# Patient Record
Sex: Male | Born: 1935 | Race: White | Hispanic: No | Smoking: Former smoker
Health system: Southern US, Community
[De-identification: ages and names within clinical notes are randomized; demographics above are authoritative.]

## PROBLEM LIST (undated history)

## (undated) DIAGNOSIS — E7800 Pure hypercholesterolemia, unspecified: Secondary | ICD-10-CM

## (undated) DIAGNOSIS — I219 Acute myocardial infarction, unspecified: Secondary | ICD-10-CM

## (undated) DIAGNOSIS — F0390 Unspecified dementia without behavioral disturbance: Secondary | ICD-10-CM

## (undated) DIAGNOSIS — I10 Essential (primary) hypertension: Secondary | ICD-10-CM

## (undated) DIAGNOSIS — G459 Transient cerebral ischemic attack, unspecified: Secondary | ICD-10-CM

## (undated) DIAGNOSIS — I629 Nontraumatic intracranial hemorrhage, unspecified: Secondary | ICD-10-CM

## (undated) DIAGNOSIS — K219 Gastro-esophageal reflux disease without esophagitis: Secondary | ICD-10-CM

## (undated) DIAGNOSIS — Z950 Presence of cardiac pacemaker: Secondary | ICD-10-CM

## (undated) DIAGNOSIS — J449 Chronic obstructive pulmonary disease, unspecified: Secondary | ICD-10-CM

## (undated) DIAGNOSIS — I2510 Atherosclerotic heart disease of native coronary artery without angina pectoris: Secondary | ICD-10-CM

## (undated) HISTORY — PX: PACEMAKER PLACEMENT: SHX43

## (undated) HISTORY — PX: CHOLECYSTECTOMY: SHX55

## (undated) HISTORY — PX: OTHER SURGICAL HISTORY: SHX169

## (undated) HISTORY — PX: APPENDECTOMY: SHX54

---

## 1999-06-11 ENCOUNTER — Inpatient Hospital Stay (HOSPITAL_COMMUNITY): Admission: EM | Admit: 1999-06-11 | Discharge: 1999-06-15 | Payer: Self-pay | Admitting: Cardiology

## 1999-06-13 ENCOUNTER — Encounter: Payer: Self-pay | Admitting: Cardiothoracic Surgery

## 1999-08-19 ENCOUNTER — Inpatient Hospital Stay (HOSPITAL_COMMUNITY): Admission: EM | Admit: 1999-08-19 | Discharge: 1999-08-22 | Payer: Self-pay | Admitting: Internal Medicine

## 1999-08-19 ENCOUNTER — Encounter: Payer: Self-pay | Admitting: Internal Medicine

## 1999-08-20 ENCOUNTER — Encounter: Payer: Self-pay | Admitting: Internal Medicine

## 2000-03-02 ENCOUNTER — Inpatient Hospital Stay (HOSPITAL_COMMUNITY): Admission: EM | Admit: 2000-03-02 | Discharge: 2000-03-07 | Payer: Self-pay | Admitting: Emergency Medicine

## 2000-03-02 ENCOUNTER — Encounter: Payer: Self-pay | Admitting: Emergency Medicine

## 2000-03-03 ENCOUNTER — Encounter: Payer: Self-pay | Admitting: Surgery

## 2000-03-04 ENCOUNTER — Encounter: Payer: Self-pay | Admitting: Surgery

## 2000-03-08 ENCOUNTER — Encounter: Payer: Self-pay | Admitting: General Surgery

## 2000-03-08 ENCOUNTER — Inpatient Hospital Stay (HOSPITAL_COMMUNITY): Admission: EM | Admit: 2000-03-08 | Discharge: 2000-03-12 | Payer: Self-pay | Admitting: Emergency Medicine

## 2000-03-09 ENCOUNTER — Encounter: Payer: Self-pay | Admitting: Family Medicine

## 2000-03-25 ENCOUNTER — Encounter: Admission: RE | Admit: 2000-03-25 | Discharge: 2000-03-25 | Payer: Self-pay | Admitting: Family Medicine

## 2000-04-30 ENCOUNTER — Ambulatory Visit (HOSPITAL_COMMUNITY): Admission: RE | Admit: 2000-04-30 | Discharge: 2000-04-30 | Payer: Self-pay

## 2000-07-17 ENCOUNTER — Emergency Department (HOSPITAL_COMMUNITY): Admission: EM | Admit: 2000-07-17 | Discharge: 2000-07-17 | Payer: Self-pay | Admitting: Emergency Medicine

## 2010-10-31 ENCOUNTER — Emergency Department (HOSPITAL_COMMUNITY): Payer: Medicare Other

## 2010-10-31 ENCOUNTER — Encounter (HOSPITAL_COMMUNITY): Payer: Self-pay | Admitting: Radiology

## 2010-10-31 ENCOUNTER — Inpatient Hospital Stay (HOSPITAL_COMMUNITY)
Admission: EM | Admit: 2010-10-31 | Discharge: 2010-11-10 | DRG: 064 | Disposition: A | Discharge status: Skilled Nursing Facility | Payer: Medicare Other | Attending: Internal Medicine | Admitting: Internal Medicine

## 2010-10-31 DIAGNOSIS — G08 Intracranial and intraspinal phlebitis and thrombophlebitis: Secondary | ICD-10-CM | POA: Diagnosis present

## 2010-10-31 DIAGNOSIS — I6200 Nontraumatic subdural hemorrhage, unspecified: Principal | ICD-10-CM | POA: Diagnosis present

## 2010-10-31 DIAGNOSIS — J9600 Acute respiratory failure, unspecified whether with hypoxia or hypercapnia: Secondary | ICD-10-CM | POA: Diagnosis present

## 2010-10-31 DIAGNOSIS — R4182 Altered mental status, unspecified: Secondary | ICD-10-CM | POA: Diagnosis present

## 2010-10-31 DIAGNOSIS — F341 Dysthymic disorder: Secondary | ICD-10-CM | POA: Diagnosis present

## 2010-10-31 DIAGNOSIS — Z950 Presence of cardiac pacemaker: Secondary | ICD-10-CM

## 2010-10-31 DIAGNOSIS — Z7982 Long term (current) use of aspirin: Secondary | ICD-10-CM

## 2010-10-31 DIAGNOSIS — I959 Hypotension, unspecified: Secondary | ICD-10-CM | POA: Diagnosis present

## 2010-10-31 DIAGNOSIS — I252 Old myocardial infarction: Secondary | ICD-10-CM

## 2010-10-31 DIAGNOSIS — N400 Enlarged prostate without lower urinary tract symptoms: Secondary | ICD-10-CM | POA: Diagnosis present

## 2010-10-31 DIAGNOSIS — R5381 Other malaise: Secondary | ICD-10-CM | POA: Diagnosis present

## 2010-10-31 DIAGNOSIS — E039 Hypothyroidism, unspecified: Secondary | ICD-10-CM | POA: Diagnosis present

## 2010-10-31 DIAGNOSIS — I2510 Atherosclerotic heart disease of native coronary artery without angina pectoris: Secondary | ICD-10-CM | POA: Diagnosis present

## 2010-10-31 DIAGNOSIS — Z87891 Personal history of nicotine dependence: Secondary | ICD-10-CM

## 2010-10-31 DIAGNOSIS — R7881 Bacteremia: Secondary | ICD-10-CM | POA: Diagnosis present

## 2010-10-31 DIAGNOSIS — X58XXXA Exposure to other specified factors, initial encounter: Secondary | ICD-10-CM | POA: Diagnosis present

## 2010-10-31 DIAGNOSIS — I10 Essential (primary) hypertension: Secondary | ICD-10-CM | POA: Diagnosis present

## 2010-10-31 DIAGNOSIS — R627 Adult failure to thrive: Secondary | ICD-10-CM | POA: Diagnosis present

## 2010-10-31 DIAGNOSIS — J4489 Other specified chronic obstructive pulmonary disease: Secondary | ICD-10-CM | POA: Diagnosis present

## 2010-10-31 DIAGNOSIS — J449 Chronic obstructive pulmonary disease, unspecified: Secondary | ICD-10-CM | POA: Diagnosis present

## 2010-10-31 DIAGNOSIS — Z8673 Personal history of transient ischemic attack (TIA), and cerebral infarction without residual deficits: Secondary | ICD-10-CM

## 2010-10-31 DIAGNOSIS — M2749 Other cysts of jaw: Secondary | ICD-10-CM | POA: Diagnosis present

## 2010-10-31 DIAGNOSIS — K219 Gastro-esophageal reflux disease without esophagitis: Secondary | ICD-10-CM | POA: Diagnosis present

## 2010-10-31 DIAGNOSIS — G40909 Epilepsy, unspecified, not intractable, without status epilepticus: Secondary | ICD-10-CM | POA: Diagnosis present

## 2010-10-31 DIAGNOSIS — G9340 Encephalopathy, unspecified: Secondary | ICD-10-CM | POA: Diagnosis present

## 2010-10-31 DIAGNOSIS — E860 Dehydration: Secondary | ICD-10-CM | POA: Diagnosis present

## 2010-10-31 DIAGNOSIS — N289 Disorder of kidney and ureter, unspecified: Secondary | ICD-10-CM | POA: Diagnosis present

## 2010-10-31 DIAGNOSIS — E119 Type 2 diabetes mellitus without complications: Secondary | ICD-10-CM | POA: Diagnosis present

## 2010-10-31 DIAGNOSIS — F068 Other specified mental disorders due to known physiological condition: Secondary | ICD-10-CM | POA: Diagnosis present

## 2010-10-31 HISTORY — DX: Presence of cardiac pacemaker: Z95.0

## 2010-10-31 HISTORY — DX: Transient cerebral ischemic attack, unspecified: G45.9

## 2010-10-31 HISTORY — DX: Pure hypercholesterolemia, unspecified: E78.00

## 2010-10-31 HISTORY — DX: Unspecified dementia, unspecified severity, without behavioral disturbance, psychotic disturbance, mood disturbance, and anxiety: F03.90

## 2010-10-31 HISTORY — DX: Chronic obstructive pulmonary disease, unspecified: J44.9

## 2010-10-31 HISTORY — DX: Essential (primary) hypertension: I10

## 2010-10-31 LAB — URINE MICROSCOPIC-ADD ON

## 2010-10-31 LAB — BLOOD GAS, ARTERIAL
Acid-base deficit: 5.3 mmol/L — ABNORMAL HIGH (ref 0.0–2.0)
Drawn by: 28340
FIO2: 1 %
MECHVT: 500 mL
O2 Saturation: 99.9 %
Patient temperature: 98.6
RATE: 14 resp/min
TCO2: 20.5 mmol/L (ref 0–100)
pCO2 arterial: 36.5 mmHg (ref 35.0–45.0)

## 2010-10-31 LAB — CBC
HCT: 39.1 % (ref 39.0–52.0)
Hemoglobin: 12.5 g/dL — ABNORMAL LOW (ref 13.0–17.0)
MCH: 28 pg (ref 26.0–34.0)
MCHC: 32 g/dL (ref 30.0–36.0)
MCV: 87.5 fL (ref 78.0–100.0)
Platelets: 176 10*3/uL (ref 150–400)
RBC: 4.47 MIL/uL (ref 4.22–5.81)
RDW: 14.1 % (ref 11.5–15.5)
WBC: 12.1 10*3/uL — ABNORMAL HIGH (ref 4.0–10.5)

## 2010-10-31 LAB — BASIC METABOLIC PANEL
BUN: 25 mg/dL — ABNORMAL HIGH (ref 6–23)
CO2: 21 mEq/L (ref 19–32)
Calcium: 9 mg/dL (ref 8.4–10.5)
Chloride: 105 mEq/L (ref 96–112)
Creatinine, Ser: 1.55 mg/dL — ABNORMAL HIGH (ref 0.50–1.35)
GFR calc Af Amer: 52 mL/min — ABNORMAL LOW (ref >60)
GFR calc non Af Amer: 43 mL/min — ABNORMAL LOW (ref >60)
Glucose, Bld: 136 mg/dL — ABNORMAL HIGH (ref 70–99)
Potassium: 4.7 mEq/L (ref 3.5–5.1)
Sodium: 136 mEq/L (ref 135–145)

## 2010-10-31 LAB — URINALYSIS, ROUTINE W REFLEX MICROSCOPIC
Bilirubin Urine: NEGATIVE
Glucose, UA: NEGATIVE mg/dL
Ketones, ur: 15 mg/dL — AB
Nitrite: NEGATIVE
Protein, ur: 100 mg/dL — AB
Specific Gravity, Urine: 1.022 (ref 1.005–1.030)
Urobilinogen, UA: 0.2 mg/dL (ref 0.0–1.0)
pH: 5.5 (ref 5.0–8.0)

## 2010-10-31 LAB — MRSA PCR SCREENING: MRSA by PCR: NEGATIVE

## 2010-11-01 ENCOUNTER — Inpatient Hospital Stay (HOSPITAL_COMMUNITY): Payer: Medicare Other

## 2010-11-01 ENCOUNTER — Encounter (HOSPITAL_COMMUNITY): Payer: Self-pay

## 2010-11-01 LAB — MAGNESIUM: Magnesium: 2.3 mg/dL (ref 1.5–2.5)

## 2010-11-01 LAB — BLOOD GAS, ARTERIAL
Acid-base deficit: 5.1 mmol/L — ABNORMAL HIGH (ref 0.0–2.0)
Bicarbonate: 19.5 mEq/L — ABNORMAL LOW (ref 20.0–24.0)
FIO2: 0.4 %
MECHVT: 500 mL
Patient temperature: 98.6
TCO2: 20.6 mmol/L (ref 0–100)
pH, Arterial: 7.355 (ref 7.350–7.450)

## 2010-11-01 LAB — GLUCOSE, CAPILLARY
Glucose-Capillary: 127 mg/dL — ABNORMAL HIGH (ref 70–99)
Glucose-Capillary: 117 mg/dL — ABNORMAL HIGH (ref 70–99)
Glucose-Capillary: 107 mg/dL — ABNORMAL HIGH (ref 70–99)
Glucose-Capillary: 109 mg/dL — ABNORMAL HIGH (ref 70–99)
Glucose-Capillary: 117 mg/dL — ABNORMAL HIGH (ref 70–99)
Glucose-Capillary: 108 mg/dL — ABNORMAL HIGH (ref 70–99)

## 2010-11-01 LAB — BASIC METABOLIC PANEL
CO2: 21 mEq/L (ref 19–32)
Calcium: 8.2 mg/dL — ABNORMAL LOW (ref 8.4–10.5)
Chloride: 109 mEq/L (ref 96–112)
Glucose, Bld: 131 mg/dL — ABNORMAL HIGH (ref 70–99)
Potassium: 4.9 mEq/L (ref 3.5–5.1)
Sodium: 137 mEq/L (ref 135–145)

## 2010-11-01 LAB — CBC
Hemoglobin: 12.1 g/dL — ABNORMAL LOW (ref 13.0–17.0)
Platelets: 164 10*3/uL (ref 150–400)
RBC: 4.13 MIL/uL — ABNORMAL LOW (ref 4.22–5.81)
WBC: 10.8 10*3/uL — ABNORMAL HIGH (ref 4.0–10.5)

## 2010-11-01 LAB — APTT: aPTT: 33 seconds (ref 24–37)

## 2010-11-01 LAB — PHOSPHORUS: Phosphorus: 2.9 mg/dL (ref 2.3–4.6)

## 2010-11-01 MED ORDER — IOHEXOL 350 MG/ML SOLN
50.0000 mL | Freq: Once | INTRAVENOUS | Status: AC | PRN
  Administered 2010-11-01: 50 mL via INTRAVENOUS

## 2010-11-01 NOTE — H&P (Signed)
[name], [name]               ACCOUNT NO.:  [id number]  MEDICAL RECORD NO.:  [id number]  LOCATION:  [facility]                         FACILITY:  [facility]  PHYSICIAN:  Isidor Holts, M.D.  DATE OF BIRTH:  1921-08-15  DATE OF ADMISSION:  10/31/2010 DATE OF DISCHARGE:                             HISTORY & PHYSICAL   PRIMARY MD:  Dr. Renae Fickle at [facility]  PRIMARY CARDIOLOGIST:  Dr. Rudolpho Sevin At [facility] (was previously under the care of Dr. Myrtis Ser, [facility] Cardiologist).  CHIEF COMPLAINT:  Altered mental status and failure to thrive.  HISTORY OF PRESENT ILLNESS:  This is an 77 year old male.  The patient is responsive only to pain stimuli and therefore was quite unable to contribute to history.  History was therefore gleaned from family, who were present in the Emergency Department.  According to the family, the patient was recently hospitalized at [facility] for 5 days, for altered mental status and fever, and had extensive workup including TEE, got a septic workup for possible source of infection, all of which proved to be negative.  The patient was subsequently discharged on October 27, 2010 on a 10-day course of cephalosporin. However, since discharge, his condition has deteriorated and for the past 2 days, he has not been eating or talking and has come progressively weak.  He was therefore brought to the Emergency Department at [facility].  PAST MEDICAL HISTORY: 1. COPD. 2. Coronary artery disease status post cardiac catheterization on     June 13, 1999, which showed moderate disease. 3. Status post NSTEMI in June 2001. 4. Hypertension. 5. Sick sinus syndrome status post permanent pacer for symptomatic     bradycardia. 6. Status post previous CVA. 7. Dementia. 8. Seizure disorder. 9. Depression/anxiety. 10.History of manic depressive disorder. 11.Cholelithiasis. 12.Diverticulosis. 13.Remote history of GI bleed secondary to  stress ulcer. 14.GERD. 15.Type II diabetes mellitus. 16.Hypothyroidism. 17.BPH. 18.DJD status post back surgery. 19.Legally blind. 20.Status post appendectomy.  ALLERGIES:  No known drug allergies.  MEDICATIONS: 1. Aspirin 81 mg p.o. daily. 2. Cefepime IV 2 g q.12 h. for 10 days from October 27, 2010. 3. Dilantin 100 mg p.o. t.i.d. 4. Glipizide 5 mg p.o. daily. 5. Lovastatin 10 mg p.o. daily. 6. Omeprazole 20 mg p.o. daily. 7. Synthroid 175 mcg p.o. daily. 8. Tamsulosin 0.4 mg p.o. at bedtime. 9. Combivent 2 puffs p.r.n. q.6 h. 10.Haldol 2 mg p.o. b.i.d. p.r.n. 11.Nitroglycerin 0.4 mg sublingually p.r.n. for chest pain.  REVIEW OF SYSTEMS:  Unobtainable from the patient, although according to the patient's family, he has no vomiting or diarrhea, neither does he have shortness of breath. Otherwise, as per HPI and chief complaint.  SOCIAL HISTORY:  The patient resides with his daughter, Steward Drone.  Ex- smoker, quit several years ago.  Nondrinker currently.  He has 5 offsprings.  FAMILY HISTORY:  Unobtainable at this time.  PHYSICAL EXAMINATION:  VITAL SIGNS:  Temperature 99.5, pulse 70 per minute and regular, respiratory rate 26, BP 124/73 mmHg, rechecked 152/67 mmHg, and pulse oximeter 96% on room air. GENERAL:  The patient was very poorly responsive at the time of this evaluation, not responding to verbal commands.  Responds to pain stimuli with sternal rub with grimacing. Mouth is partly open.  Oral mucous membranes appear dry. NECK:  Supple.  JVP not seen. CHEST:  Clinically clear to auscultation.  No wheezes or crackles. HEART:  Sounds one and two heard, normal  and regular.  No murmurs. ABDOMEN:  Moderately obese, soft, and nontender.  No palpable organomegaly.  No palpable masses.  Bowel sounds are heard. LOWER EXTREMITIES:  No pitting edema.  Palpable peripheral pulses. MUSCULOSKELETAL:  The patient does have generalized osteoarthritic changes.  However, unable to  fully examine secondary to lack of cooperation.  It appears that the patient does have lower extremity contractures. CENTRAL NERVOUS SYSTEM:  Essentially as described above.  No obvious facial asymmetry.  However, the patient is not cooperative and certainly was not moving any of his limbs at the time of this evaluation.  INVESTIGATIONS:  CBC; WBC 12.1, hemoglobin 12.5, hematocrit 39.1, and platelets 176.  Electrolytes; sodium 136, potassium 4.7, chloride 105, CO2 of 21, BUN 25, creatinine 1.55, and glucose 136.  Urinalysis shows wbc's 0-2, rbc's 0-2, bacteria few.  Chest x-ray on October 31, 2010, shows low lung volumes.  No acute findings.  There was a 12-lead transvenous pacemaker in situ.  A 12-lead EKG on October 31, 2010, showed sinus rhythm and possible left bundle-branch block pattern.  ASSESSMENT AND PLAN: 1. Altered mental status.  The patient is very poorly responsive and     currently has no obvious focus of infection.  Differential     diagnostic considerations are somewhat broad and include possible     cerebrovascular accident, given his known history of cerebrovascular     disease and hypertension, toxic metabolic encephalopathy, sepsis,     and central nervous system infection.  We shall admit to step-down     unit for close observation, do brain CT scan as MRI is not feasible     due to the presence of pacemaker.  Do complete septic workup.     Commence empiric antibiotic treatment with a combination of     vancomycin and Primaxin.  The patient will need lumbar puncture if     head CT scan is negative.  2. Dehydration/acute renal insufficiency.  Baseline creatinine is     unknown, but the patient does have an elevated BUN/creatinine ratio     and clinically appears "dry."  We shall manage with intravenous     fluid hydration.  3. Hypothyroidism.  We shall continue replacement therapy, albeit IV.  4. Type II diabetes mellitus.  The patient's random blood glucose  is     currently satisfactory at 136.  We shall place him on a sliding     scale insulin coverage for now.  5. History of seizure disorder.  We shall continue with Dilantin,     albeit intravenously and do EEG.   Further management will depend on clinical course.    Note: I have had a detailed discussion about the     patient and his clinical condition with his son, who was present in     the Emergency Department as well as his daughter, Steward Drone (telephone     number [phone]) and according to the family, the patient is     full code, although I have indicated to them that his prognosis is     very guarded.     Isidor Holts, M.D.     CO/MEDQ  D:  10/31/2010  T:  10/31/2010  Job:  [id number]  cc:   Renae Fickle Dr. Rudolpho Sevin  Electronically Signed by Isidor Holts M.D. on 11/01/2010 12:22:13 PM

## 2010-11-02 ENCOUNTER — Inpatient Hospital Stay (HOSPITAL_COMMUNITY): Payer: Medicare Other

## 2010-11-02 DIAGNOSIS — I6200 Nontraumatic subdural hemorrhage, unspecified: Secondary | ICD-10-CM

## 2010-11-02 DIAGNOSIS — J9600 Acute respiratory failure, unspecified whether with hypoxia or hypercapnia: Secondary | ICD-10-CM

## 2010-11-02 DIAGNOSIS — R4182 Altered mental status, unspecified: Secondary | ICD-10-CM

## 2010-11-02 LAB — GLUCOSE, CAPILLARY
Glucose-Capillary: 135 mg/dL — ABNORMAL HIGH (ref 70–99)
Glucose-Capillary: 147 mg/dL — ABNORMAL HIGH (ref 70–99)
Glucose-Capillary: 136 mg/dL — ABNORMAL HIGH (ref 70–99)

## 2010-11-02 LAB — BLOOD GAS, ARTERIAL
Acid-base deficit: 6.8 mmol/L — ABNORMAL HIGH (ref 0.0–2.0)
FIO2: 0.3 %
TCO2: 18.4 mmol/L (ref 0–100)
pCO2 arterial: 30.6 mmHg — ABNORMAL LOW (ref 35.0–45.0)
pO2, Arterial: 103 mmHg — ABNORMAL HIGH (ref 80.0–100.0)

## 2010-11-02 LAB — BASIC METABOLIC PANEL
BUN: 24 mg/dL — ABNORMAL HIGH (ref 6–23)
CO2: 19 mEq/L (ref 19–32)
Chloride: 111 mEq/L (ref 96–112)
Creatinine, Ser: 1.33 mg/dL (ref 0.50–1.35)

## 2010-11-02 LAB — URINE CULTURE
Culture  Setup Time: 201209051419
Culture: NO GROWTH

## 2010-11-02 LAB — CBC
HCT: 33.1 % — ABNORMAL LOW (ref 39.0–52.0)
MCV: 89 fL (ref 78.0–100.0)
RBC: 3.72 MIL/uL — ABNORMAL LOW (ref 4.22–5.81)
WBC: 9.7 10*3/uL (ref 4.0–10.5)

## 2010-11-02 LAB — MAGNESIUM: Magnesium: 2.2 mg/dL (ref 1.5–2.5)

## 2010-11-03 ENCOUNTER — Inpatient Hospital Stay (HOSPITAL_COMMUNITY): Payer: Medicare Other

## 2010-11-03 LAB — HEPARIN LEVEL (UNFRACTIONATED)
Heparin Unfractionated: 0.14 IU/mL — ABNORMAL LOW (ref 0.30–0.70)
Heparin Unfractionated: 0.16 IU/mL — ABNORMAL LOW (ref 0.30–0.70)

## 2010-11-03 LAB — BLOOD GAS, ARTERIAL
Drawn by: 347641
MECHVT: 500 mL
Patient temperature: 98.6
RATE: 14 resp/min
pCO2 arterial: 34.5 mmHg — ABNORMAL LOW (ref 35.0–45.0)
pH, Arterial: 7.349 — ABNORMAL LOW (ref 7.350–7.450)

## 2010-11-03 LAB — CBC
HCT: 30.8 % — ABNORMAL LOW (ref 39.0–52.0)
Hemoglobin: 9.9 g/dL — ABNORMAL LOW (ref 13.0–17.0)
MCV: 89.3 fL (ref 78.0–100.0)
RBC: 3.45 MIL/uL — ABNORMAL LOW (ref 4.22–5.81)
RDW: 14.5 % (ref 11.5–15.5)
WBC: 7.7 10*3/uL (ref 4.0–10.5)

## 2010-11-03 LAB — BASIC METABOLIC PANEL
BUN: 28 mg/dL — ABNORMAL HIGH (ref 6–23)
CO2: 19 mEq/L (ref 19–32)
Chloride: 114 mEq/L — ABNORMAL HIGH (ref 96–112)
Creatinine, Ser: 1.28 mg/dL (ref 0.50–1.35)
Glucose, Bld: 136 mg/dL — ABNORMAL HIGH (ref 70–99)

## 2010-11-03 LAB — CULTURE, RESPIRATORY W GRAM STAIN

## 2010-11-03 LAB — GLUCOSE, CAPILLARY
Glucose-Capillary: 101 mg/dL — ABNORMAL HIGH (ref 70–99)
Glucose-Capillary: 102 mg/dL — ABNORMAL HIGH (ref 70–99)

## 2010-11-03 NOTE — Consult Note (Signed)
NAMEKYIAN, OBST               ACCOUNT NO.:  [id number]  MEDICAL RECORD NO.:  [id number]  LOCATION:  3109                         FACILITY:  [facility]  PHYSICIAN:  Levie Heritage, MD       DATE OF BIRTH:  11-Mar-1921  DATE OF CONSULTATION: DATE OF DISCHARGE:                                CONSULTATION   REASON FOR CONSULTATION:  Altered mental status in the setting of falx subdural hematoma.  This is a 77 year old male Caucasian patient who was brought to [facility] secondary to altered mental status and unresponsiveness. Majority of the history was obtained from the chart as the patient's family members and the patient is unable to provide history.  Apparently the patient was recently hospitalized at [facility] for approximately 5 days for altered mental status and fever.  An extensive workup was done at [facility] including TEE and septic workup.  No source of infection was found at that time.  The patient was subsequently discharged on October 27, 2010 for 10-day course of cephalosporin.  However, due to not talking, eating, and failure to thrive, the patient was brought back to [facility].  Upon admission to [facility], the patient had a CT scan of his brain which showed subdural pain hemorrhage along the falx and tentorium.  No mass effect or midline shift was noted.  The patient had second CT scan while hospitalized on September 5 which showed no change in subdural hematoma and appeared to be stable.  Neurology was consulted for both prognostication and recommendations for treatment of subdural hematoma.  PAST MEDICAL HISTORY:  COPD, coronary artery disease status post non- STEMI in June 2001, hypertension, sick sinus syndrome, status post pacemaker for symptomatic bradycardia, status post previous CVA, dementia, seizure disorder, depression, anxiety, history of manic depressive disorder, cholelithiasis,  diverticulosis.  History of GI bleed secondary to stress ulcer, GERD, type 2 diabetes, hypothyroidism, BPH, DJD, legally blind, status post appendectomy.  ALLERGIES:  No known drug allergies.  While hospitalized, the patient has been placed on alteplase x1, sliding- scale insulin, levothyroxine, Protonix, Dilantin 100 mg IV q.8 h., Zosyn, vancomycin, Tylenol, Combivent.  FAMILY HISTORY:  Noncontributory.  SOCIAL HISTORY:  Unable to be obtained.  REVIEW OF SYSTEMS:  Unable to be obtained.  PHYSICAL EXAMINATION:  VITAL SIGNS:  Blood pressure is 130/80, pulse 70, respiration 18. GENERAL:  The patient is at this point in time intubated.  He is on propofol. NEUROLOGIC:  Pupils equal, round, reactive to light and accommodating. Positive doll's eyes with head movement.  He does not blink to threat. He has positive corneal reflexes.  Face appears symmetrical.  Sensation is grossly intact.  Finger-to-nose unobtainable.  Heel-to-shin unobtainable.  Motor:  Does not show any purposeful spontaneous movements, however, he does have normal tone and bulk.  Deep tendon reflexes are 2+ throughout in the upper extremities, 1+ in the patellas, 0 at the Achilles.  He has downgoing toes.  Sensation is grossly intact as he winces to pain in all 4 extremities.  He also withdraws to pain and bilateral legs and bilateral upper extremities. PULMONARY:  Clear to auscultation. CARDIOVASCULAR:  S1, S2 is audible. NECK:  Negative for bruits.  LABORATORY DATA:  Sodium 137, potassium 4.9 chloride 109, CO2 is 21, glucose 131, BUN 25, creatinine 1.46.  Respiratory culture is pending. INR is 1.12.  PT is 14.6.  Urine culture is pending.  Blood cultures are pending.  Urine microscopic shows few squamous epithelial, 0-2 white blood cells, 0-2 red blood cells.  Urinalysis does show trace leukocytes but no nitrites.  IMAGING:  As noted above.  ASSESSMENT and PLAN:  This is an 77 year old male with unknown  etiology of altered mental status in the setting of a falx subdural hematoma (as mentioned in  CT head read).  The patient has recently been worked up for sepsis at [facility] which is non-conclusive.    My impression is that this patient's altered mental status is the result of  intracranial pathology as noted on CT head. However, I strongly suggest MR or CT venogram in this setting to rule out intracranial venous sinus thrombosis, which is very likley in this clinical picture. Additionally, We suggest getting his EEG although there is no clinical evidence favouring  epileptic seizures concerns so far for his altered mental status. We have also discussed with Dr Molli Knock of c. care of our impression.  We would recommend avoiding sedation if medically  possible.  We will continue to follow the patient.  We thank you very much for this consultation.     Felicie Morn, PA-C  I have seen this patient and agree with above clinical findings. ______________________________ Levie Heritage, MD    DS/MEDQ  D:  11/01/2010  T:  11/01/2010  Job:  [id number]  cc:   Levie Heritage, MD  Electronically Signed by Felicie Morn PA-C on 11/03/2010 10:01:48 AM Electronically Signed by Levie Heritage MD on 11/03/2010 05:30:03 PM

## 2010-11-04 ENCOUNTER — Inpatient Hospital Stay (HOSPITAL_COMMUNITY): Payer: Medicare Other

## 2010-11-04 LAB — BASIC METABOLIC PANEL
BUN: 29 mg/dL — ABNORMAL HIGH (ref 6–23)
Calcium: 8.2 mg/dL — ABNORMAL LOW (ref 8.4–10.5)
Creatinine, Ser: 1.41 mg/dL — ABNORMAL HIGH (ref 0.50–1.35)
GFR calc Af Amer: 58 mL/min — ABNORMAL LOW (ref >60)
GFR calc non Af Amer: 48 mL/min — ABNORMAL LOW (ref >60)
Glucose, Bld: 90 mg/dL (ref 70–99)
Potassium: 5.1 mEq/L (ref 3.5–5.1)

## 2010-11-04 LAB — HEPARIN LEVEL (UNFRACTIONATED)
Heparin Unfractionated: 0.14 IU/mL — ABNORMAL LOW (ref 0.30–0.70)
Heparin Unfractionated: 0.21 IU/mL — ABNORMAL LOW (ref 0.30–0.70)
Heparin Unfractionated: 0.29 IU/mL — ABNORMAL LOW (ref 0.30–0.70)

## 2010-11-04 LAB — CBC
HCT: 33.8 % — ABNORMAL LOW (ref 39.0–52.0)
Hemoglobin: 11 g/dL — ABNORMAL LOW (ref 13.0–17.0)
MCH: 28.9 pg (ref 26.0–34.0)
MCHC: 32.5 g/dL (ref 30.0–36.0)
RDW: 14.7 % (ref 11.5–15.5)

## 2010-11-04 LAB — GLUCOSE, CAPILLARY
Glucose-Capillary: 94 mg/dL (ref 70–99)
Glucose-Capillary: 111 mg/dL — ABNORMAL HIGH (ref 70–99)

## 2010-11-05 DIAGNOSIS — J9600 Acute respiratory failure, unspecified whether with hypoxia or hypercapnia: Secondary | ICD-10-CM

## 2010-11-05 DIAGNOSIS — I6200 Nontraumatic subdural hemorrhage, unspecified: Secondary | ICD-10-CM

## 2010-11-05 DIAGNOSIS — R4182 Altered mental status, unspecified: Secondary | ICD-10-CM

## 2010-11-05 LAB — GLUCOSE, CAPILLARY
Glucose-Capillary: 118 mg/dL — ABNORMAL HIGH (ref 70–99)
Glucose-Capillary: 125 mg/dL — ABNORMAL HIGH (ref 70–99)
Glucose-Capillary: 156 mg/dL — ABNORMAL HIGH (ref 70–99)

## 2010-11-05 LAB — BASIC METABOLIC PANEL
CO2: 21 mEq/L (ref 19–32)
Calcium: 8.1 mg/dL — ABNORMAL LOW (ref 8.4–10.5)
GFR calc non Af Amer: 42 mL/min — ABNORMAL LOW (ref >60)
Glucose, Bld: 133 mg/dL — ABNORMAL HIGH (ref 70–99)
Potassium: 4.1 mEq/L (ref 3.5–5.1)
Sodium: 141 mEq/L (ref 135–145)

## 2010-11-05 LAB — HEPARIN LEVEL (UNFRACTIONATED): Heparin Unfractionated: 0.39 IU/mL (ref 0.30–0.70)

## 2010-11-05 LAB — PHOSPHORUS: Phosphorus: 2.9 mg/dL (ref 2.3–4.6)

## 2010-11-05 LAB — MAGNESIUM: Magnesium: 2.1 mg/dL (ref 1.5–2.5)

## 2010-11-06 ENCOUNTER — Other Ambulatory Visit (HOSPITAL_COMMUNITY): Payer: Medicare Other

## 2010-11-06 DIAGNOSIS — S065X9A Traumatic subdural hemorrhage with loss of consciousness of unspecified duration, initial encounter: Secondary | ICD-10-CM

## 2010-11-06 LAB — CBC
HCT: 33.8 % — ABNORMAL LOW (ref 39.0–52.0)
Hemoglobin: 11.3 g/dL — ABNORMAL LOW (ref 13.0–17.0)
MCV: 88.5 fL (ref 78.0–100.0)
WBC: 6.9 10*3/uL (ref 4.0–10.5)

## 2010-11-06 LAB — BASIC METABOLIC PANEL
CO2: 22 mEq/L (ref 19–32)
Chloride: 107 mEq/L (ref 96–112)
Glucose, Bld: 108 mg/dL — ABNORMAL HIGH (ref 70–99)
Potassium: 4.1 mEq/L (ref 3.5–5.1)
Sodium: 140 mEq/L (ref 135–145)

## 2010-11-06 LAB — GLUCOSE, CAPILLARY
Glucose-Capillary: 124 mg/dL — ABNORMAL HIGH (ref 70–99)
Glucose-Capillary: 105 mg/dL — ABNORMAL HIGH (ref 70–99)
Glucose-Capillary: 113 mg/dL — ABNORMAL HIGH (ref 70–99)

## 2010-11-06 LAB — PROTIME-INR: INR: 1.11 (ref 0.00–1.49)

## 2010-11-06 LAB — HEPARIN LEVEL (UNFRACTIONATED): Heparin Unfractionated: 0.4 IU/mL (ref 0.30–0.70)

## 2010-11-06 LAB — CULTURE, BLOOD (ROUTINE X 2)
Culture  Setup Time: 201209042354
Culture: NO GROWTH

## 2010-11-06 LAB — MAGNESIUM: Magnesium: 2.2 mg/dL (ref 1.5–2.5)

## 2010-11-06 LAB — PHOSPHORUS: Phosphorus: 2.9 mg/dL (ref 2.3–4.6)

## 2010-11-06 NOTE — Procedures (Unsigned)
HISTORY:  An 77 year old man with altered mental status after having sinus venous thrombosis intracranial.  EEG for evaluation.  MEDICATIONS:  Haldol, Synthroid, NovoLog, Protonix, Combivent, vancomycin, and Dilantin.  EEG DURATION:  This is 25.5 minutes of EEG recording.  EEG DESCRIPTION:  This is a routine 16 channel adult EEG recording with one channel devoted to limited EKG recording.  Activation procedure is performed using the photic stimulation and the study is performed in awake and mild drowsy state.  As the EEG opens up, I do not see any clear location for posterior dominant rhythm.  The background rhythm shows generalized slowing from high delta to mid theta ranges 6 Hz at the most.  There are no electrographic seizures or epileptiform discharges on the study.  I do observe some vertex waves and synchronous spindles as the patient was drowsy during the study.  However, no deeper stages of sleep are noted. Some shaking of the left arm was noted by the technician.  However, the patient woke up on calling him while doing that and also there is no EEG abnormality to favor an epileptic etiology for that.  EEG INTERPRETATION:  This is an abnormal EEG due to generalized theta range slowing and this EEG is reactive.  However, there is no evidence to suggest electrographic seizures or epileptiform discharges.  This degree of reactive generalized slowing suggest moderate degree of nonspecific encephalopathy and can be seen in a variety of toxic, metabolic, ischemic, and structural pathologies.  Clinical correlation is advised.          ______________________________ Levie Heritage, MD    ZO:XWRU D:  11/02/2010 14:58:47  T:  11/02/2010 22:14:24  Job #:  [id number]

## 2010-11-07 LAB — PROTIME-INR: INR: 1.26 (ref 0.00–1.49)

## 2010-11-07 LAB — GLUCOSE, CAPILLARY
Glucose-Capillary: 121 mg/dL — ABNORMAL HIGH (ref 70–99)
Glucose-Capillary: 120 mg/dL — ABNORMAL HIGH (ref 70–99)
Glucose-Capillary: 128 mg/dL — ABNORMAL HIGH (ref 70–99)

## 2010-11-07 LAB — CBC
HCT: 33.7 % — ABNORMAL LOW (ref 39.0–52.0)
MCH: 28.9 pg (ref 26.0–34.0)
MCV: 88.5 fL (ref 78.0–100.0)
RDW: 14.4 % (ref 11.5–15.5)
WBC: 7.7 10*3/uL (ref 4.0–10.5)

## 2010-11-08 LAB — CBC
MCHC: 32.9 g/dL (ref 30.0–36.0)
Platelets: 223 10*3/uL (ref 150–400)
RDW: 14.5 % (ref 11.5–15.5)
WBC: 8.1 10*3/uL (ref 4.0–10.5)

## 2010-11-08 LAB — PROTIME-INR
INR: 1.33 (ref 0.00–1.49)
Prothrombin Time: 16.7 seconds — ABNORMAL HIGH (ref 11.6–15.2)

## 2010-11-08 LAB — HEPARIN LEVEL (UNFRACTIONATED): Heparin Unfractionated: 0.26 IU/mL — ABNORMAL LOW (ref 0.30–0.70)

## 2010-11-08 LAB — GLUCOSE, CAPILLARY
Glucose-Capillary: 122 mg/dL — ABNORMAL HIGH (ref 70–99)
Glucose-Capillary: 111 mg/dL — ABNORMAL HIGH (ref 70–99)

## 2010-11-08 NOTE — Consult Note (Signed)
  NAMEGRAEME, MENEES               ACCOUNT NO.:  [id number]  MEDICAL RECORD NO.:  [id number]  LOCATION:  3109                         FACILITY:  [facility]  PHYSICIAN:  Hilda Lias, M.D.   DATE OF BIRTH:  Nov 09, 1921  DATE OF CONSULTATION:  10/31/2010 DATE OF DISCHARGE:                                CONSULTATION   Mr. Schroth is a 77 year old gentleman who was brought to [facility] because of decreased level of conscious.  He is at home under the care of his wife.  There is no evidence of any trauma history. Nevertheless, the patient was brought to the emergency room and because there was some concern about him being having difficulty breathing he was intubated.  He was paralyzed with propofol.  He had a CT scan and that is the main reason we were called.  PAST MEDICAL HISTORY:  He has a history of diabetes, high blood pressure and COPD.  At the present time he is intubated.  The blood pressure 170/50 with a pulse of 76.  The pupils are 2 mm.  Due to pain he moves slightly all four extremities.  There is no evidence of any blood or CSF coming from the ear or from the nose.  The CT scan of the head showed that he has a tentorial subdural hematoma.  There is no evidence of any blood collection, there is no shift.  The patient has quite a bit of cerebral atrophy.  CLINICAL IMPRESSION:  Subdural hematoma tentorium.  Cerebral atrophy. History of chronic obstructive pulmonary disease, diabetes, hypertension.  RECOMMENDATIONS:  The patient is going to be admitted for the critical care medicine service.  He is going to go to a neurosurgical intensive care unit.  We are going to follow him while he is in the hospital.  At the present time there is no need for any surgical intervention, although we are going to repeat the CT scan over the next 12 hours.          ______________________________ Hilda Lias, M.D.     EB/MEDQ  D:  10/31/2010  T:  11/01/2010  Job:   [id number]  Electronically Signed by Hilda Lias M.D. on 11/08/2010 07:15:24 PM

## 2010-11-09 ENCOUNTER — Inpatient Hospital Stay (HOSPITAL_COMMUNITY): Payer: Medicare Other

## 2010-11-09 LAB — COMPREHENSIVE METABOLIC PANEL
ALT: 12 U/L (ref 0–53)
Albumin: 2.3 g/dL — ABNORMAL LOW (ref 3.5–5.2)
Alkaline Phosphatase: 155 U/L — ABNORMAL HIGH (ref 39–117)
BUN: 22 mg/dL (ref 6–23)
Calcium: 9 mg/dL (ref 8.4–10.5)
GFR calc Af Amer: 56 mL/min — ABNORMAL LOW (ref >60)
Potassium: 4.5 mEq/L (ref 3.5–5.1)
Sodium: 142 mEq/L (ref 135–145)
Total Protein: 6.7 g/dL (ref 6.0–8.3)

## 2010-11-09 LAB — CBC
Hemoglobin: 11.2 g/dL — ABNORMAL LOW (ref 13.0–17.0)
Platelets: 214 10*3/uL (ref 150–400)
RBC: 3.94 MIL/uL — ABNORMAL LOW (ref 4.22–5.81)

## 2010-11-09 LAB — PROTIME-INR: Prothrombin Time: 19.2 seconds — ABNORMAL HIGH (ref 11.6–15.2)

## 2010-11-09 LAB — PHENYTOIN LEVEL, FREE AND TOTAL: Phenytoin, Free: 0.5 mg/L — ABNORMAL LOW (ref 1.0–2.0)

## 2010-11-09 LAB — GLUCOSE, CAPILLARY
Glucose-Capillary: 106 mg/dL — ABNORMAL HIGH (ref 70–99)
Glucose-Capillary: 132 mg/dL — ABNORMAL HIGH (ref 70–99)

## 2010-11-09 LAB — HEPARIN LEVEL (UNFRACTIONATED): Heparin Unfractionated: 0.6 IU/mL (ref 0.30–0.70)

## 2010-11-09 LAB — PHENYTOIN LEVEL, TOTAL: Phenytoin Lvl: 3.2 ug/mL — ABNORMAL LOW (ref 10.0–20.0)

## 2010-11-10 LAB — CBC
HCT: 34.5 % — ABNORMAL LOW (ref 39.0–52.0)
Hemoglobin: 11.1 g/dL — ABNORMAL LOW (ref 13.0–17.0)
MCH: 28.6 pg (ref 26.0–34.0)
MCV: 88.9 fL (ref 78.0–100.0)
Platelets: 212 10*3/uL (ref 150–400)
RBC: 3.88 MIL/uL — ABNORMAL LOW (ref 4.22–5.81)
WBC: 8.5 10*3/uL (ref 4.0–10.5)

## 2010-11-10 LAB — PROTIME-INR: INR: 2.06 — ABNORMAL HIGH (ref 0.00–1.49)

## 2010-11-10 LAB — GLUCOSE, CAPILLARY: Glucose-Capillary: 93 mg/dL (ref 70–99)

## 2010-11-13 LAB — MISCELLANEOUS TEST

## 2010-11-21 NOTE — Discharge Summary (Signed)
NAMEFACUNDO, [name]               ACCOUNT NO.:  [id number]  MEDICAL RECORD NO.:  [id number]  LOCATION:  2610                         FACILITY:  [facility]  PHYSICIAN:  Erick Blinks, MD     DATE OF BIRTH:  [birth year]  DATE OF ADMISSION:  10/31/2010 DATE OF DISCHARGE:                        DISCHARGE SUMMARY - REFERRING   PRIMARY CARE PHYSICIAN:  Dr. Renae Fickle at [facility].  DISCHARGE DIAGNOSES: 1. Encephalopathy, resolved. 2. Dementia. 3. Acute respiratory failure status post intubation for airway     protection, resolved. 4. 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 5. Parafalcine and tentorial hyperdensity/hypodensity likely subdural     hematoma. 6. Maxillary cyst, benign. 7. Chronic obstructive pulmonary disease. 8. Type 2 diabetes. 9. Hypertension. 10.Benign prostatic hypertrophy. 11.Severe deconditioning.  DISCHARGE MEDICATIONS: 1. Oxycodone 5 mg p.o. q.6 h. p.r.n. 2. MiraLax 17 grams p.o. daily p.r.n. 3. Dilantin 100 mg p.o. t.i.d. 4. Prilosec 20 mg p.o. daily. 5. Synthroid 175 mcg 1 tablet p.o. daily. 6. Tamsulosin 0.4 mg 1 capsule p.o. at bedtime. 7. Combivent 2 puffs inhaled q.6 h. P.r.n. 8. Haldol 2 mg 1 tablet p.o. b.i.d. p.r.n. 9. Nitroglycerin sublingual 0.4 mg 1 tablet p.o. q.5 minutes p.r.n.     x3. 10.Coumadin 7.5 mg p.o. daily to be adjusted based on INR.  ADMISSION HISTORY:  This is a 77 year old gentleman who was brought to the emergency room with altered mental status and failure to thrive.  On arrival to the emergency room, he was minimally responsive and therefore history was from family as well as emergency department record.  The patient had recently been hospitalized at the [facility] for 5 days for altered mental status and fever.  He had an extensive workup at that time for sepsis.  He was on a 10-day  course of cephalosporins and was discharged, however, after discharge he began to deteriorate.  He was brought to the emergency room for evaluation since he was not eating, was increasingly weak and less responsive.  For details, please refer to the history and physical per Dr. Brien Few on September 4.  HOSPITAL COURSE: 1. Respiratory failure.  The patient was becoming increasingly     encephalopathic and could not protect his airway.  He did require     intubation for airway protection and was subsequently transferred     to the Critical Care Service.  He had a CT scan of the head done     which did show subdural hematoma as well as a central venous     thrombosis.  He was seen in consultation by both Neurosurgery and     Neurology Service.  He did not require any acute surgical     intervention but he was placed on anticoagulation with a close     monitoring and followup CT scans.  He did not have any significant     change in his neurologic exam and he should be continued on     anticoagulation therapy.  Currently his INR is in goal range  between 2 and 3 and he will need further adjustment of his Coumadin     based on his INR level.  He will need to follow up with Dr. Pearlean Brownie     in 2 months' time.  His encephalopathy has improved and he is     currently back to his baseline.  He does have some underlying     dementia. 2. Maxillary cyst.  The patient was noted to have a maxillary cyst on     his CT scan.  He was seen in consultation by Dr. Ezzard Standing from ENT     and felt that no further workup was necessary for this unless the     patient became symptomatic. 3. Diabetes.  The patient was previously on glipizide prior to     admission and that was held since he was admitted and his "blood     sugars have been in reasonable range."  This can be restarted if     blood pressure starts trending up. 4. Deconditioning.  The patient will be transferred to skilled nursing     facility for  continued physical therapy.  CONSULTATIONS: 1. Neurology. 2. Neurosurgery. 3. ENT. 4. Critical Care Medicine.  DIAGNOSTIC IMAGING: 1. Chest x-ray on admission shows low lung volumes, no acute findings. 2. CT head on September 4 shows subdural hemorrhage along the falx and     tentorium, no mass effect or midline shift, significant central     cortical atrophy, periventricular white matter changes consistent     with small-vessel disease. 3. CT head on September 5 shows a stable appearance of subdural     hematoma.  Since the prior study no acute changes, bony erosion of     the maxilla and midline just below the anterior maxillary spine of     unknown etiology. 4. CT maxillofacial without contrast on September 5 shows smoothly     marginated bony erosions involving the central maxilla, benign     processes given the smooth margins. 5. CT angio of the head shows thrombosis of the superior sagittal     sinus, straight sinus, transverse sinus, and portion of the left     sigmoid sinus, parafalcine and tentorial hyperdensities as     hyperdensity may represent a combination of subdural blood and     venous engorgement from collaterals 6. CT angio of the neck shows atherosclerotic changes without evidence     of hemodynamically significant stenosis. 7. CT head on September 8 shows thrombosis, superior sagittal sinus,     straight sinus, and transverse sinus.  Abnormal density along and     falx and tentorium compatible with blood products which is likely     subdural and may also be a component of collateral venous     engorgement.  Overall no change from prior exams. 8. X-ray of the right shoulder shows degenerative changes in the right     glenohumeral joint.  No acute abnormality. 9. X-ray of the left shoulder shows mild degenerative joint disease of     the left shoulder.  DISCHARGE INSTRUCTIONS:  The patient should continue on a heart-healthy low-calorie diet, conduct his  activities tolerated.  Follow up with his primary care physician once he is discharged from the facility.  He will need to follow up with Dr. Pearlean Brownie in 2 months' time.  CONDITION AT TIME OF DISCHARGE:  Improved.     Erick Blinks, MD     JM/MEDQ  D:  11/10/2010  T:  11/10/2010  Job:  [id number]  cc:   Renae Fickle  Electronically Signed by Durward Mallard Ada Holness  on 11/21/2010 07:50:14 PM

## 2010-11-23 NOTE — Consult Note (Signed)
  NAMEEMILIANO, WELSHANS               ACCOUNT NO.:  [id number]  MEDICAL RECORD NO.:  [id number]  LOCATION:  2610                         FACILITY:  [facility]  PHYSICIAN:  Kristine Garbe. Ezzard Standing, M.D.DATE OF BIRTH:  Sep 03, 1921  DATE OF CONSULTATION:  11/08/2010 DATE OF DISCHARGE:                                CONSULTATION   REASON FOR CONSULTATION:  Evaluate the patient with maxillary bone cyst.  BRIEF HISTORY:  [name] is an 77 year old gentleman who is recently admitted for altered mental status.  He has undergone couple of CT scans of his head and one CT scan was noted to have a cystic lesion in the midline maxilla bone and just above the alveolus.  He subsequently underwent a maxillofacial CT scan which shows a well- demarcated smooth, around cystic bone lesion in the mid maxilla and just above the alveolus.  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  On exam at bedside, the mucosa over the palate alveolus was smooth with no mucosal lesions noted.  IMPRESSION:  This is a benign-appearing bony cystic lesion has been there for several years, no further therapy is necessary unless this becomes symptomatic.          ______________________________ Kristine Garbe. Ezzard Standing, M.D.     CEN/MEDQ  D:  11/08/2010  T:  11/08/2010  Job:  [id number]  Electronically Signed by Dillard Cannon M.D. on 11/23/2010 01:34:55 AM

## 2010-12-08 ENCOUNTER — Inpatient Hospital Stay (HOSPITAL_COMMUNITY)
Admission: AD | Admit: 2010-12-08 | Discharge: 2010-12-11 | DRG: 303 | Disposition: A | Discharge status: Home Health | Payer: Medicare Other | Source: Other Acute Inpatient Hospital | Attending: Cardiology | Admitting: Cardiology

## 2010-12-08 DIAGNOSIS — J4489 Other specified chronic obstructive pulmonary disease: Secondary | ICD-10-CM | POA: Diagnosis present

## 2010-12-08 DIAGNOSIS — E039 Hypothyroidism, unspecified: Secondary | ICD-10-CM | POA: Diagnosis present

## 2010-12-08 DIAGNOSIS — Z7901 Long term (current) use of anticoagulants: Secondary | ICD-10-CM

## 2010-12-08 DIAGNOSIS — Z86718 Personal history of other venous thrombosis and embolism: Secondary | ICD-10-CM

## 2010-12-08 DIAGNOSIS — H543 Unqualified visual loss, both eyes: Secondary | ICD-10-CM | POA: Diagnosis present

## 2010-12-08 DIAGNOSIS — F3289 Other specified depressive episodes: Secondary | ICD-10-CM | POA: Diagnosis present

## 2010-12-08 DIAGNOSIS — R079 Chest pain, unspecified: Secondary | ICD-10-CM

## 2010-12-08 DIAGNOSIS — R5381 Other malaise: Secondary | ICD-10-CM | POA: Diagnosis present

## 2010-12-08 DIAGNOSIS — H3530 Unspecified macular degeneration: Secondary | ICD-10-CM | POA: Diagnosis present

## 2010-12-08 DIAGNOSIS — J449 Chronic obstructive pulmonary disease, unspecified: Secondary | ICD-10-CM | POA: Diagnosis present

## 2010-12-08 DIAGNOSIS — I2510 Atherosclerotic heart disease of native coronary artery without angina pectoris: Principal | ICD-10-CM | POA: Diagnosis present

## 2010-12-08 DIAGNOSIS — G40909 Epilepsy, unspecified, not intractable, without status epilepticus: Secondary | ICD-10-CM | POA: Diagnosis present

## 2010-12-08 DIAGNOSIS — F411 Generalized anxiety disorder: Secondary | ICD-10-CM | POA: Diagnosis present

## 2010-12-08 DIAGNOSIS — F0390 Unspecified dementia without behavioral disturbance: Secondary | ICD-10-CM | POA: Diagnosis present

## 2010-12-08 DIAGNOSIS — I10 Essential (primary) hypertension: Secondary | ICD-10-CM | POA: Diagnosis present

## 2010-12-08 DIAGNOSIS — F329 Major depressive disorder, single episode, unspecified: Secondary | ICD-10-CM | POA: Diagnosis present

## 2010-12-08 DIAGNOSIS — Z79899 Other long term (current) drug therapy: Secondary | ICD-10-CM

## 2010-12-08 DIAGNOSIS — E119 Type 2 diabetes mellitus without complications: Secondary | ICD-10-CM | POA: Diagnosis present

## 2010-12-08 DIAGNOSIS — Z950 Presence of cardiac pacemaker: Secondary | ICD-10-CM

## 2010-12-08 DIAGNOSIS — N400 Enlarged prostate without lower urinary tract symptoms: Secondary | ICD-10-CM | POA: Diagnosis present

## 2010-12-08 LAB — CARDIAC PANEL(CRET KIN+CKTOT+MB+TROPI)
CK, MB: 2.2 ng/mL (ref 0.3–4.0)
Relative Index: INVALID (ref 0.0–2.5)
Total CK: 37 U/L (ref 7–232)
Troponin I: <0.3 ng/mL (ref <0.30)

## 2010-12-08 LAB — GLUCOSE, CAPILLARY

## 2010-12-09 DIAGNOSIS — R079 Chest pain, unspecified: Secondary | ICD-10-CM

## 2010-12-09 DIAGNOSIS — I2510 Atherosclerotic heart disease of native coronary artery without angina pectoris: Secondary | ICD-10-CM

## 2010-12-09 LAB — CARDIAC PANEL(CRET KIN+CKTOT+MB+TROPI)
Relative Index: INVALID (ref 0.0–2.5)
Total CK: 38 U/L (ref 7–232)
Troponin I: <0.3 ng/mL (ref <0.30)

## 2010-12-09 LAB — TSH: TSH: 7.505 u[IU]/mL — ABNORMAL HIGH (ref 0.350–4.500)

## 2010-12-09 LAB — GLUCOSE, CAPILLARY
Glucose-Capillary: 120 mg/dL — ABNORMAL HIGH (ref 70–99)
Glucose-Capillary: 88 mg/dL (ref 70–99)

## 2010-12-09 LAB — PROTIME-INR
INR: 1.72 — ABNORMAL HIGH (ref 0.00–1.49)
Prothrombin Time: 20.4 seconds — ABNORMAL HIGH (ref 11.6–15.2)
Prothrombin Time: 20.5 seconds — ABNORMAL HIGH (ref 11.6–15.2)

## 2010-12-10 LAB — GLUCOSE, CAPILLARY
Glucose-Capillary: 115 mg/dL — ABNORMAL HIGH (ref 70–99)
Glucose-Capillary: 119 mg/dL — ABNORMAL HIGH (ref 70–99)
Glucose-Capillary: 93 mg/dL (ref 70–99)

## 2010-12-10 LAB — PROTIME-INR: Prothrombin Time: 23.6 seconds — ABNORMAL HIGH (ref 11.6–15.2)

## 2010-12-11 LAB — PROTIME-INR: INR: 1.94 — ABNORMAL HIGH (ref 0.00–1.49)

## 2010-12-19 ENCOUNTER — Telehealth: Payer: Self-pay | Admitting: Cardiology

## 2010-12-19 NOTE — Telephone Encounter (Signed)
[name] aware that pt is following up with [facility] Cardiology and/or [name].  She will call them for further orders.

## 2010-12-19 NOTE — Telephone Encounter (Signed)
[name] from [facility] calling in regards to pt PT INR. Orders were received on October 16th yet the next orders have not been received. [name] would like to know when to do the next PT INR.   [name] also calling to see if MD wants pt to continue taking aspirin while on coumadin. Pt daughter has not been giving pt aspirin considering he is on coumadin. Does MD want to D/C aspirin. Please return call to discuss further.

## 2010-12-26 NOTE — H&P (Signed)
NAMEJAS, [name] NO.:  [id number]  MEDICAL RECORD NO.:  [id number]  LOCATION:  3701                         FACILITY:  [facility]  PHYSICIAN:  Rollene Rotunda, MD, FACCDATE OF BIRTH:  10/24/1921  DATE OF ADMISSION:  12/08/2010 DATE OF DISCHARGE:                             HISTORY & PHYSICAL   PRIMARY CARE PROVIDER:  Renae Fickle, MD  CARDIOLOGIST:  [facility].  REASON FOR PRESENTATION:  Evaluate patient with chest pain.  HISTORY OF PRESENT ILLNESS:  The patient is 77 year old.  He was discharged recently from [facility] after a long hospitalization.  He had encephalopathy and respiratory failure.  He had a central venous thrombosis.  There was mention of a subdural hematoma on CT as well.  He does have a history of coronary artery disease as described below.  He has been at the rehab center.  He has a long history of dementia.  He was getting ready to leave the rehab center actually tomorrow.  He gets up to a chair.  He can stand, but he cannot walk.  He apparently had some chest discomfort.  His daughter heard the report that it was a pressure-like sensation.  He had 3 nitroglycerines.  However, it is not clear that this helped.  He went to the [facility] Emergency Room.  His EKG is paced.  His first set of enzymes were negative.  He is not complaining of pain now.  He cannot give a history because of his dementia.  There has been no report of this in the days prior to this. He has not had any witnessed shortness of breath, PND, or orthopnea. There has been no presyncope or syncope.  PAST MEDICAL HISTORY: 1. CAD (catheterization 2001, with a somewhat unclear description of     50% LAD stenosis, 70% diagonal stenosis, 90% apparent obtuse     marginal stenosis, and 50-70% ramus intermediate stenosis.  He was     managed medically). 2. Dementia. 3. Central venous thrombosis. 4. COPD. 5. Diabetes mellitus. 6. Hypertension. 7. Benign prostatic  hypertrophy. 8. Sick sinus syndrome status post pacemaker. 9. Blindness with macular degeneration. 10.Hypothyroidism. 11.Gastroesophageal reflux disease. 12.Depression/anxiety.  PAST SURGICAL HISTORY:  Appendectomy, back surgery.  ALLERGIES:  None.  MEDICATIONS: 1. Coumadin. 2. Dilantin 100 mg b.i.d. and 200 mg at bedtime. 3. Glipizide 5 mg daily. 4. Lovastatin 10 mg daily. 5. Prilosec 40 mg daily. 6. Synthroid 200 mcg daily. 7. Tamsulosin 0.4 mg at bedtime. 8. Combivent. 9. Haldol. 10.Colace. 11.Sinemet. 12.Oxycodone.  SOCIAL HISTORY:  The patient was living with his daughter when he was in nursing home.  He is not smoking cigarettes and does not drink alcohol.  FAMILY HISTORY:  Unobtainable from the patient and noncontributory with his advanced age.  REVIEW OF SYSTEMS:  Unobtainable from the patient.  PHYSICAL EXAMINATION:  GENERAL:  He is in no distress.  He is demented but pleasant. VITAL SIGNS:  Blood pressure 136/91, heart rate 81 and regular, afebrile. HEENT:  Eyelids are unremarkable, lens opacification, pupils sluggishly reactive, fundi not visualized, oral mucosa unremarkable, edentulous. NECK:  No jugular venous distention at 45 degrees.  Carotid upstroke brisk and symmetrical.  No bruits, no thyromegaly. LYMPHATICS:  No cervical, axillary, or inguinal adenopathy. LUNGS:  Clear to auscultation bilaterally. BACK:  No costovertebral angle tenderness. CHEST:  Well-healed right pacemaker scar. HEART:  PMI not displaced or sustained, S1 and S2 within normal limits. No S3, no S4.  No clicks, rubs, no murmurs. ABDOMEN:  Flat, positive bowel sounds, normal frequency and pitch.  No bruits.  No rebound, no guarding, no midline pulsatile mass.  No hepatomegaly.  No splenomegaly.  SKIN:  No rashes, no nodules. EXTREMITIES:  2+ pulses, no edema. NEURO:  He is oriented to himself but not to time or situation.  He does move all extremities though is diffusely  weak.  LABORATORY DATA:  WBC 7.8, hemoglobin 12.4, platelets 144.  Sodium 138, potassium 5.0, BUN 32, creatinine 1.24, CK 39, MB 2.0, INR 1.65, phenytoin 7.4.  ASSESSMENT AND PLAN: 1. Chest discomfort.  The patient does have some vague chest     discomfort.  Thus far, there is no objective evidence of ischemia.     I will treat this as ischemic pain but will manage it     conservatively with medications.  I have discussed this with the     daughter and she agrees.  I will continue to cycle enzymes.  He has     been taken off his antianginals including apparent isosorbide in     the past recently with his hospitalization because of hypotension.     He is currently tolerating nitro paste.  He can be switched to     Imdur prior to discharge.  Certainly, would not consider invasive     or noninvasive evaluation if he has no further chest discomfort or     objective evidence of ischemia. 2. Central venous thrombosis.  His INR is subtherapeutic.  I will have     the Pharmacy manage this.  I am not going to bridge him with     heparin at this point but will use deep vein thrombosis     prophylaxis, compression stockings. 3. Diabetes.  He will continue the medications as listed. 4. Hypothyroidism.  I will check a TSH and continue the medications as     listed. 5. Depression/anxiety.  He will continue his previous medications and     we will use fall precautions.     Rollene Rotunda, MD, [facility]     JH/MEDQ  D:  12/08/2010  T:  12/08/2010  Job:  [id number]  cc:   [name]  Electronically Signed by Rollene Rotunda MD [facility] on 12/26/2010 05:45:43 PM

## 2010-12-29 NOTE — Discharge Summary (Signed)
NAMEJOVANTE, [name]                  ACCOUNT NO.:  [id number]  MEDICAL RECORD NO.:  [id number]  LOCATION:  3701                         FACILITY:  [facility]  PHYSICIAN:  Walter Sans. Mylena Sedberry, MD, FACCDATE OF BIRTH:  12/23/1921  DATE OF ADMISSION:  12/08/2010 DATE OF DISCHARGE:  12/11/2010                              DISCHARGE SUMMARY   PROCEDURES:  None.  PRIMARY FINAL DISCHARGE DIAGNOSES: 1. Chest pain, medical therapy for coronary artery disease     recommended. 2. History of central venous thrombus on Coumadin, INR 1.94 at     discharge. 3. History of hospitalization for encephalopathy and respiratory     failure from September 4 through November 10, 2010. 4. Status post cardiac catheterization in 2001 showing multivessel     disease between 50 and 70% except for a small diagonal with a 90%     lesion, medical therapy recommended. 5. Chronic obstructive pulmonary disease. 6. Diabetes. 7. Hypertension. 8. Benign prostatic hypertrophy. 9. History of sick sinus syndrome status post permanent pacemaker. 10.Blindness with macular degeneration. 11.Hypothyroidism, TSH 7.505 this admission. 12.Gastroesophageal reflux disease. 13.Depression/anxiety. 14.Status post appendectomy and back surgery. 15.Chronic cholecystitis diagnosed in 2002. 16.Deconditioning and weakness.  TIME AT DISCHARGE:  42 minutes.  HOSPITAL COURSE:  [name] is an 77 year old male with a history of coronary artery disease.  He was discharged on November 10, 2010 and came back to the hospital on December 08, 2010 with chest pain.  He was admitted for further evaluation and treatment.  His cardiac enzymes were negative for MI.  His INR was subtherapeutic but his Coumadin was continued and his INR was only slightly subtherapeutic at discharge.  This will be followed closely.  A home health RN was ordered for help with management.  He was placed on nitro paste on admission which helped his chest pain but had Imdur  added to his medication regimen.  Physical therapy evaluation was limited by the patient's complaint of dizziness, although his systolic blood pressure was not significantly decreased.  The family is aware that he requires 24 hour assistance and states they will provide it.  Home health will be able to provide some assistance with this.  On December 11, 2010, he was seen by physical therapy.  He was evaluated by Dr. Antoine Navarro who recommended no further workup.  Dr. Antoine Navarro considered him stable for discharge, to follow up as an outpatient.  DISCHARGE INSTRUCTIONS: 1. His activity level to be increased gradually.  He is encouraged to     stick to a low-sodium heart healthy diabetic diet.  He is to follow     up with [facility] and with Dr. Samuel Navarro.  He is to get a     home health RN out to the house to do an INR on Wednesday.  DISCHARGE MEDICATIONS: 1. Tylenol 650 mg q.6 hours p.r.n. 2. Coumadin 7.5 mg 1 tablet q.p.m. 3. Combivent 2 puffs q.6 hours p.r.n. 4. Haldol 2 mg b.i.d. p.r.n. 5. Colace 100 mg b.i.d. 6. MiraLax 17 g b.i.d. as prior to admission. 7. Sinemet 25/100 t.i.d. 8. Dilantin 100 mg b.i.d. with 2 tablets at bedtime. 9. Sublingual nitroglycerin p.r.n. 10.Imdur  30 mg a day. 11.Oxycodone 5 mg 1 tablet b.i.d. 12.Celebrex 200 mg a day. 13.Prilosec 40 mg a day. 14.Flomax 0.4 mg at bedtime. 15.Synthroid 200 mcg daily. 16.Lovastatin 10 mg daily as prior to admission. 17.Glipizide 5 mg daily as prior to admission.     Walter Demark, PA-C   ______________________________ Walter Sans Daleen Squibb, MD, [facility]    RB/MEDQ  D:  12/11/2010  T:  12/12/2010  Job:  [id number]  cc:   [facility] [name]  Electronically Signed by Walter Demark PA-C on 12/18/2010 08:03:30 PM Electronically Signed by Valera Castle MD [facility] on 12/29/2010 01:38:28 PM

## 2011-05-22 ENCOUNTER — Other Ambulatory Visit: Payer: Self-pay | Admitting: Neurology

## 2011-05-22 DIAGNOSIS — I6789 Other cerebrovascular disease: Secondary | ICD-10-CM

## 2011-05-25 ENCOUNTER — Ambulatory Visit
Admission: RE | Admit: 2011-05-25 | Discharge: 2011-05-25 | Disposition: A | Discharge status: Home / Self Care | Payer: Medicare Other | Source: Ambulatory Visit | Attending: Neurology | Admitting: Neurology

## 2011-05-25 DIAGNOSIS — I6789 Other cerebrovascular disease: Secondary | ICD-10-CM

## 2011-05-25 MED ORDER — IOHEXOL 300 MG/ML  SOLN
50.0000 mL | Freq: Once | INTRAMUSCULAR | Status: AC | PRN
  Administered 2011-05-25: 50 mL via INTRAVENOUS

## 2011-07-12 ENCOUNTER — Emergency Department (HOSPITAL_COMMUNITY): Payer: Medicare Other

## 2011-07-12 ENCOUNTER — Encounter (HOSPITAL_COMMUNITY): Payer: Self-pay

## 2011-07-12 ENCOUNTER — Emergency Department (HOSPITAL_COMMUNITY)
Admission: EM | Admit: 2011-07-12 | Discharge: 2011-07-12 | Disposition: A | Discharge status: Home / Self Care | Payer: Medicare Other | Attending: Emergency Medicine | Admitting: Emergency Medicine

## 2011-07-12 DIAGNOSIS — J4489 Other specified chronic obstructive pulmonary disease: Secondary | ICD-10-CM | POA: Insufficient documentation

## 2011-07-12 DIAGNOSIS — I252 Old myocardial infarction: Secondary | ICD-10-CM | POA: Insufficient documentation

## 2011-07-12 DIAGNOSIS — Z950 Presence of cardiac pacemaker: Secondary | ICD-10-CM | POA: Insufficient documentation

## 2011-07-12 DIAGNOSIS — F0390 Unspecified dementia without behavioral disturbance: Secondary | ICD-10-CM | POA: Insufficient documentation

## 2011-07-12 DIAGNOSIS — E119 Type 2 diabetes mellitus without complications: Secondary | ICD-10-CM | POA: Insufficient documentation

## 2011-07-12 DIAGNOSIS — Z8673 Personal history of transient ischemic attack (TIA), and cerebral infarction without residual deficits: Secondary | ICD-10-CM | POA: Insufficient documentation

## 2011-07-12 DIAGNOSIS — N390 Urinary tract infection, site not specified: Secondary | ICD-10-CM

## 2011-07-12 DIAGNOSIS — E860 Dehydration: Secondary | ICD-10-CM | POA: Insufficient documentation

## 2011-07-12 DIAGNOSIS — Z7901 Long term (current) use of anticoagulants: Secondary | ICD-10-CM | POA: Insufficient documentation

## 2011-07-12 DIAGNOSIS — K219 Gastro-esophageal reflux disease without esophagitis: Secondary | ICD-10-CM | POA: Insufficient documentation

## 2011-07-12 DIAGNOSIS — I10 Essential (primary) hypertension: Secondary | ICD-10-CM | POA: Insufficient documentation

## 2011-07-12 DIAGNOSIS — J449 Chronic obstructive pulmonary disease, unspecified: Secondary | ICD-10-CM | POA: Insufficient documentation

## 2011-07-12 DIAGNOSIS — I959 Hypotension, unspecified: Secondary | ICD-10-CM | POA: Insufficient documentation

## 2011-07-12 DIAGNOSIS — R42 Dizziness and giddiness: Secondary | ICD-10-CM | POA: Insufficient documentation

## 2011-07-12 DIAGNOSIS — Z87891 Personal history of nicotine dependence: Secondary | ICD-10-CM | POA: Insufficient documentation

## 2011-07-12 HISTORY — DX: Acute myocardial infarction, unspecified: I21.9

## 2011-07-12 HISTORY — DX: Gastro-esophageal reflux disease without esophagitis: K21.9

## 2011-07-12 HISTORY — DX: Nontraumatic intracranial hemorrhage, unspecified: I62.9

## 2011-07-12 HISTORY — DX: Atherosclerotic heart disease of native coronary artery without angina pectoris: I25.10

## 2011-07-12 LAB — CBC
HCT: 43.2 % (ref 39.0–52.0)
MCH: 28 pg (ref 26.0–34.0)
MCV: 84.5 fL (ref 78.0–100.0)
Platelets: 136 10*3/uL — ABNORMAL LOW (ref 150–400)
RDW: 15.1 % (ref 11.5–15.5)
WBC: 8 10*3/uL (ref 4.0–10.5)

## 2011-07-12 LAB — URINALYSIS, ROUTINE W REFLEX MICROSCOPIC
Glucose, UA: NEGATIVE mg/dL
Ketones, ur: NEGATIVE mg/dL
Protein, ur: 30 mg/dL — AB
Urobilinogen, UA: 0.2 mg/dL (ref 0.0–1.0)

## 2011-07-12 LAB — DIFFERENTIAL
Basophils Absolute: 0.1 10*3/uL (ref 0.0–0.1)
Eosinophils Absolute: 0.1 10*3/uL (ref 0.0–0.7)
Eosinophils Relative: 1 % (ref 0–5)
Lymphocytes Relative: 24 % (ref 12–46)
Lymphs Abs: 1.9 10*3/uL (ref 0.7–4.0)
Monocytes Absolute: 0.4 10*3/uL (ref 0.1–1.0)

## 2011-07-12 LAB — BASIC METABOLIC PANEL
CO2: 21 mEq/L (ref 19–32)
Calcium: 9.3 mg/dL (ref 8.4–10.5)
Creatinine, Ser: 1.69 mg/dL — ABNORMAL HIGH (ref 0.50–1.35)
GFR calc non Af Amer: 35 mL/min — ABNORMAL LOW (ref >90)
Glucose, Bld: 157 mg/dL — ABNORMAL HIGH (ref 70–99)
Sodium: 137 mEq/L (ref 135–145)

## 2011-07-12 LAB — TROPONIN I: Troponin I: <0.3 ng/mL (ref <0.30)

## 2011-07-12 LAB — LACTIC ACID, PLASMA: Lactic Acid, Venous: 1.6 mmol/L (ref 0.5–2.2)

## 2011-07-12 LAB — RAPID STREP SCREEN (MED CTR MEBANE ONLY): Streptococcus, Group A Screen (Direct): NEGATIVE

## 2011-07-12 MED ORDER — SODIUM CHLORIDE 0.9 % IV SOLN
INTRAVENOUS | Status: DC
Start: 2011-07-12 — End: 2011-07-12
  Administered 2011-07-12 (×2): via INTRAVENOUS

## 2011-07-12 MED ORDER — SODIUM CHLORIDE 0.9 % IV BOLUS (SEPSIS)
500.0000 mL | Freq: Once | INTRAVENOUS | Status: AC
  Administered 2011-07-12: 500 mL via INTRAVENOUS

## 2011-07-12 MED ORDER — CEPHALEXIN 500 MG PO CAPS: 500.0000 mg | ORAL_CAPSULE | Freq: Four times a day (QID) | ORAL | Status: AC

## 2011-07-12 MED ORDER — MENTHOL 3 MG MT LOZG
1.0000 | LOZENGE | OROMUCOSAL | Status: DC | PRN
  Administered 2011-07-12: 3 mg via ORAL
  Filled 2011-07-12 (×2): qty 9

## 2011-07-12 MED ORDER — DEXTROSE 5 % IV SOLN
1.0000 g | Freq: Once | INTRAVENOUS | Status: AC
  Administered 2011-07-12: 1 g via INTRAVENOUS
  Filled 2011-07-12: qty 10

## 2011-07-12 NOTE — ED Notes (Signed)
Rx given x1 D/c instructions reviewed w/ pt and family - pt and family deny any further questions or concerns at present. Pt in no acute distress, assisted to d/c via wheelchair by EMT

## 2011-07-12 NOTE — ED Provider Notes (Signed)
History     CSN: [id number]  Arrival date & time 07/12/11  1323   First MD Initiated Contact with Patient 07/12/11 1325      Chief Complaint  Patient presents with  . Hypotension    (Consider location/radiation/quality/duration/timing/severity/associated sxs/prior treatment) HPI Comments: [name] is a 78 y.o. Male who is visiting his cardiologist office today for dizziness. When it was discovered that he was potentially. Is brought to this facility because his daughter did not want him admitted at [facility] During transport. EMS gave him IV fluids, and he presented here normotensive. Patient cannot give history as to why he is here or what his problems are.   Level V Caveat-dementia    Patient's wife states that he was at his cardiologist office today to have his pacemaker interrogated, routinely. His father be hypotensive and sent here. He is NED well not have fever or nausea or vomiting, change in bowel habit. He has had urinary hesitancy for several weeks. He has not had recent infections or hospitalizations.  The history is provided by the patient.    Past Medical History  Diagnosis Date  . Hypertension   . Diabetes mellitus   . TIA (transient ischemic attack)   . Pacemaker   . COPD (chronic obstructive pulmonary disease)   . High cholesterol   . Pacemaker   . Senile dementia   . Intracranial bleed   . MI (myocardial infarction)   . GERD (gastroesophageal reflux disease)   . Coronary artery disease     Past Surgical History  Procedure Date  . Pacemaker placement   . Appendectomy   . Cholecystectomy   . Hemilaminectomy     with disc removal one lumbar interspace    History reviewed. No pertinent family history.  History  Substance Use Topics  . Smoking status: Former Games developer  . Smokeless tobacco: Not on file  . Alcohol Use: No      Review of Systems  All other systems reviewed and are negative.    Allergies  Morphine and  related  Home Medications   Current Outpatient Rx  Name Route Sig Dispense Refill  . IPRATROPIUM-ALBUTEROL 18-103 MCG/ACT IN AERO Inhalation Inhale 2 puffs into the lungs every 4 (four) hours as needed. For wheezing.    Marland Kitchen HEMOCYTE PLUS 106-1 MG PO CAPS Oral Take 1 tablet by mouth daily.    Marland Kitchen GLIPIZIDE 5 MG PO TABS Oral Take 5 mg by mouth 2 (two) times daily before a meal.    . ISOSORBIDE MONONITRATE ER 30 MG PO TB24 Oral Take 30 mg by mouth daily.    Marland Kitchen LEVOTHYROXINE SODIUM 200 MCG PO TABS Oral Take 200 mcg by mouth daily.    Marland Kitchen LOVASTATIN 10 MG PO TABS Oral Take 10 mg by mouth at bedtime.    Marland Kitchen MUPIROCIN CALCIUM 2 % EX CREA Topical Apply 1 application topically 3 (three) times daily.    Marland Kitchen NITROGLYCERIN 0.4 MG SL SUBL Sublingual Place 0.4 mg under the tongue every 5 (five) minutes as needed. For chest pain.    Marland Kitchen OMEPRAZOLE 40 MG PO CPDR Oral Take 40 mg by mouth daily.    Marland Kitchen TAMSULOSIN HCL 0.4 MG PO CAPS Oral Take 0.4 mg by mouth at bedtime.    . WARFARIN SODIUM 7.5 MG PO TABS Oral Take 7.5 mg by mouth daily.    . CEPHALEXIN 500 MG PO CAPS Oral Take 1 capsule (500 mg total) by mouth 4 (four) times  daily. 28 capsule 0    BP 139/81  Pulse 86  Temp(Src) 97.7 F (36.5 C) (Axillary)  Resp 16  Ht 5\' 11"  (1.803 m)  Wt 200 lb (90.719 kg)  BMI 27.89 kg/m2  SpO2 100%  Physical Exam  Nursing note and vitals reviewed. Constitutional: He appears well-developed.       Frail  HENT:  Head: Normocephalic and atraumatic.  Right Ear: External ear normal.  Left Ear: External ear normal.  Eyes: Conjunctivae and EOM are normal. Pupils are equal, round, and reactive to light.  Neck: Normal range of motion and phonation normal. Neck supple.  Cardiovascular: Normal rate, regular rhythm, normal heart sounds and intact distal pulses.   Pulmonary/Chest: Effort normal and breath sounds normal. He exhibits no bony tenderness.  Abdominal: Soft. Normal appearance and bowel sounds are normal. He exhibits no  mass. There is no tenderness. There is no guarding.       No palpable abdominal mass. Aorta is not palpable.  Musculoskeletal: Normal range of motion.  Neurological: He is alert. He has normal strength. No cranial nerve deficit or sensory deficit. He exhibits normal muscle tone. Coordination normal.       He is cooperative, and interactive, and follows commands  Skin: Skin is warm, dry and intact.  Psychiatric: His behavior is normal.    ED Course  Procedures (including critical care time)    Date: 07/12/2011  Rate: 88  Rhythm: AV paced  QRS Axis: NA   Intervals: normal QT  ST/T Wave abnormalities: nonspecific abnormality  Conduction Disutrbances: NA  Narrative Interpretation:   Old EKG Reviewed: unchanged Emergency department treatment: Repeated IV fluid boluses, improved, blood pressure. At 16:20 . Patient was hungry, food tray, is ordered. Urinalysis still pending.     Labs Reviewed  CBC - Abnormal; Notable for the following:    Platelets 136 (*)    All other components within normal limits  BASIC METABOLIC PANEL - Abnormal; Notable for the following:    Glucose, Bld 157 (*)    BUN 25 (*)    Creatinine, Ser 1.69 (*)    GFR calc non Af Amer 35 (*)    GFR calc Af Amer 40 (*)    All other components within normal limits  URINALYSIS, ROUTINE W REFLEX MICROSCOPIC - Abnormal; Notable for the following:    APPearance CLOUDY (*)    Hgb urine dipstick SMALL (*)    Protein, ur 30 (*)    Leukocytes, UA LARGE (*)    All other components within normal limits  URINE MICROSCOPIC-ADD ON - Abnormal; Notable for the following:    Bacteria, UA FEW (*)    Casts HYALINE CASTS (*)    All other components within normal limits  DIFFERENTIAL  TROPONIN I  LACTIC ACID, PLASMA  URINE CULTURE   Dg Chest Port 1 View  07/12/2011  *RADIOLOGY REPORT*  Clinical Data: Hypotension  PORTABLE CHEST - 1 VIEW  Comparison: Portable exam 1349 hours compared to 11/04/2010  Findings: Right subclavian  sequential transveous pacemaker leads project at right atrium and right ventricle. Normal heart size and pulmonary vascularity. Mildly tortuous thoracic aorta. Lungs clear. No pleural effusion or pneumothorax. Minimal chronic peribronchial thickening identified. No acute bony lesions.  IMPRESSION: Pacemaker. Minimal bronchitic changes.  Original Report Authenticated By: Lollie Marrow, M.D.   Trended Labs: Component     Latest Ref Rng 11/09/2010 07/12/2011  Sodium     135 - 145 mEq/L 142 137  Potassium  3.5 - 5.1 mEq/L 4.5 4.1  Chloride     96 - 112 mEq/L 106 103  CO2     19 - 32 mEq/L 25 21  Glucose     70 - 99 mg/dL 161 (H) 096 (H)  BUN     6 - 23 mg/dL 22 25 (H)  Creat     0.45 - 1.35 mg/dL 4.09 (H) 8.11 (H)  Calcium     8.4 - 10.5 mg/dL 9.0 9.3  GFR calc non Af Amer     >90 mL/min 46 (L) 35 (L)  GFR calc Af Amer     >90 mL/min 56 (L) 40 (L)  --> Mildly lower GFR, with stable BUN, and hyperglycemia (non-fasting).   1. Hypotension   2. UTI (lower urinary tract infection)       MDM  Weakness, with  hypotension, and dehydration. Apparently, urinary tract infection. Culture ordered. Patient improved to the emergency department treatment.   Plan: Rocephin, IV ordered. Description for Keflex to use at home for UTI. Patient to be evaluated by Dr. Oletta Lamas prior to discharge.        Flint Melter, MD 07/12/11 [phone]

## 2011-07-12 NOTE — ED Notes (Signed)
Pt denies any dizziness with change of position. Only c/o sore throat. When standing patient sts he "just don't feel good". When asked what he means he replies with "I don't know, my throat still hurts".

## 2011-07-12 NOTE — ED Notes (Signed)
Per EMS, from [facility] in [facility]. Pt sts that he just doesn't feel well. Pt went in for routine device interrogation for pacemaker.  Pt reports dizziness for a while and went in to see cardiologist. Blood pressure 70/50 and BP in trendelenburg was 120/70.  HR 86. Denies any pain.

## 2011-07-12 NOTE — Discharge Instructions (Signed)
Drink plenty of fluids each day. Take Tylenol if needed for fever. Call your Dr. for a followup appointment in 5-7 days. Return here if needed for problems  Hypotension As your heart beats, it forces blood through your arteries. This force is your blood pressure. If your blood pressure is too low for you to go about your normal activities or support the organs of your body, you have hypotension, or low blood pressure. When your blood pressure becomes too low, you may not get enough blood to your brain, and you may feel weak, lightheaded, or develop a more rapid heart rate. In a more severe case, you may faint: this is a sudden, brief loss of consciousness where you pass out and recover completely. CAUSES  Loss of blood or fluids from the body. This occurs during rapid blood loss. It can also come from dehydration when the body is not taking in enough fluids or is losing fluids faster than they can be replaced. Examples of this would be severe vomiting and diarrhea.   Not taking in enough fluids and salts. This is common in the elderly where thirst mechanisms are not working as well. This means you do not feel thirsty and you do not take in enough water.   Use of blood pressure pills and other medications that may lower the blood pressure below normal.   Over medication (always take your medications as directed).   Irregular heart beat or heart failure when the heart is no longer working well enough to support blood pressure.  Hospitalization is sometimes required for low blood pressure if fluid or blood replacement is needed, if time is needed for medications to wear off, or if further evaluation is needed. Less common causes of low blood pressure might include peripheral or autonomic neuropathy (nerve problems), Parkinson's disease, or other illnesses. Treatment might include a change in diet, change in medications (including medicines aimed at raising your blood pressure), and use of support  stockings. HOME CARE INSTRUCTIONS   Maintain good fluid intake and use a little more salt on your food (if you are not on a restricted diet or having problems with your heart such as heart failure). This is especially important for the elderly when you may not feel thirsty in the winter.   Take your medications as directed.   Get up slowly from reclining or sitting positions. This gives your blood pressure a chance to adjust. As we grow older our ability to regulate our blood pressure may not be as good as when we were younger.   Wear support stockings if prescribed.   Use walkers, canes, etc., if advised.   Talk with your physician or nurse about a Home Safety Evaluation (usually done by visiting nurses).  SEEK IMMEDIATE MEDICAL CARE IF:   You have a fainting episode. Do not drive yourself. Call 911 if no other help is available.   You have chest pain, nausea (feeling sick to your stomach) or vomiting.   You have a loss of feeling in some part of your body, or lose movement in your arms or legs.   You have difficulty with speech.   You become sweaty and/or feel light headed.  Make sure you are re-checked as instructed. MAKE SURE YOU:   Understand these instructions.   Will watch your condition.   Will get help right away if you are not doing well or get worse.  Document Released: 02/12/2005 Document Revised: 02/01/2011 Document Reviewed: 10/03/2007 [employer] Patient Information 2012  [employer].Urinary Tract Infection Infections of the urinary tract can start in several places. A bladder infection (cystitis), a kidney infection (pyelonephritis), and a prostate infection (prostatitis) are different types of urinary tract infections (UTIs). They usually get better if treated with medicines (antibiotics) that kill germs. Take all the medicine until it is gone. You or your child may feel better in a few days, but TAKE ALL MEDICINE or the infection may not respond and may become  more difficult to treat. HOME CARE INSTRUCTIONS   Drink enough water and fluids to keep the urine clear or pale yellow. Cranberry juice is especially recommended, in addition to large amounts of water.   Avoid caffeine, tea, and carbonated beverages. They tend to irritate the bladder.   Alcohol may irritate the prostate.   Only take over-the-counter or prescription medicines for pain, discomfort, or fever as directed by your caregiver.  To prevent further infections:  Empty the bladder often. Avoid holding urine for long periods of time.   After a bowel movement, women should cleanse from front to back. Use each tissue only once.   Empty the bladder before and after sexual intercourse.  FINDING OUT THE RESULTS OF YOUR TEST Not all test results are available during your visit. If your or your child's test results are not back during the visit, make an appointment with your caregiver to find out the results. Do not assume everything is normal if you have not heard from your caregiver or the medical facility. It is important for you to follow up on all test results. SEEK MEDICAL CARE IF:   There is back pain.   Your baby is older than 3 months with a rectal temperature of 100.5 F (38.1 C) or higher for more than 1 day.   Your or your child's problems (symptoms) are no better in 3 days. Return sooner if you or your child is getting worse.  SEEK IMMEDIATE MEDICAL CARE IF:   There is severe back pain or lower abdominal pain.   You or your child develops chills.   You have a fever.   Your baby is older than 3 months with a rectal temperature of 102 F (38.9 C) or higher.   Your baby is 84 months old or younger with a rectal temperature of 100.4 F (38 C) or higher.   There is nausea or vomiting.   There is continued burning or discomfort with urination.  MAKE SURE YOU:   Understand these instructions.   Will watch your condition.   Will get help right away if you are not  doing well or get worse.  Document Released: 11/22/2004 Document Revised: 02/01/2011 Document Reviewed: 06/27/2006 [facility] Patient Information 2012 [place], [place].

## 2011-07-12 NOTE — ED Provider Notes (Signed)
Pt has received IV abx and orthostatics shows his mild hypotension due to standing has improved with hydration.  Pt did complain of mild sore throat times 1 day.  oropharynx is clear, uvula is midline.  Strep screen is neg.  Pt received abx already.  Pt can swallow.  Pt had an ECG that showed no ischemia.  Pt can follow up with PCP.    Gavin Pound. Bellah Alia, MD 07/12/11 1950

## 2011-07-14 LAB — URINE CULTURE: Colony Count: 40000

## 2011-07-15 NOTE — ED Notes (Signed)
+   urine culture. Resistant to prescribed Keflex. Chart sent to EDP office for review 

## 2011-07-17 NOTE — ED Notes (Signed)
Give  Macrobid 100 mg bid  X 7 days [facility] [phone] written by Charlestine Massed by PDC,PFM.

## 2012-01-01 ENCOUNTER — Other Ambulatory Visit: Payer: Self-pay | Admitting: Cardiology

## 2012-02-16 ENCOUNTER — Emergency Department (HOSPITAL_COMMUNITY): Payer: Medicare Other

## 2012-02-16 ENCOUNTER — Inpatient Hospital Stay (HOSPITAL_COMMUNITY)
Admission: EM | Admit: 2012-02-16 | Discharge: 2012-02-22 | DRG: 391 | Disposition: A | Discharge status: Home / Self Care | Payer: Medicare Other | Attending: Family Medicine | Admitting: Family Medicine

## 2012-02-16 ENCOUNTER — Other Ambulatory Visit (HOSPITAL_COMMUNITY): Payer: Medicare Other

## 2012-02-16 ENCOUNTER — Encounter (HOSPITAL_COMMUNITY): Payer: Self-pay

## 2012-02-16 DIAGNOSIS — I2510 Atherosclerotic heart disease of native coronary artery without angina pectoris: Secondary | ICD-10-CM

## 2012-02-16 DIAGNOSIS — I5020 Unspecified systolic (congestive) heart failure: Secondary | ICD-10-CM

## 2012-02-16 DIAGNOSIS — Z7901 Long term (current) use of anticoagulants: Secondary | ICD-10-CM

## 2012-02-16 DIAGNOSIS — Z79899 Other long term (current) drug therapy: Secondary | ICD-10-CM

## 2012-02-16 DIAGNOSIS — R079 Chest pain, unspecified: Secondary | ICD-10-CM | POA: Diagnosis present

## 2012-02-16 DIAGNOSIS — I252 Old myocardial infarction: Secondary | ICD-10-CM

## 2012-02-16 DIAGNOSIS — B9689 Other specified bacterial agents as the cause of diseases classified elsewhere: Secondary | ICD-10-CM | POA: Diagnosis present

## 2012-02-16 DIAGNOSIS — I10 Essential (primary) hypertension: Secondary | ICD-10-CM

## 2012-02-16 DIAGNOSIS — E872 Acidosis, unspecified: Secondary | ICD-10-CM | POA: Diagnosis present

## 2012-02-16 DIAGNOSIS — D72829 Elevated white blood cell count, unspecified: Secondary | ICD-10-CM

## 2012-02-16 DIAGNOSIS — R7881 Bacteremia: Secondary | ICD-10-CM

## 2012-02-16 DIAGNOSIS — J4489 Other specified chronic obstructive pulmonary disease: Secondary | ICD-10-CM | POA: Diagnosis present

## 2012-02-16 DIAGNOSIS — E119 Type 2 diabetes mellitus without complications: Secondary | ICD-10-CM | POA: Diagnosis present

## 2012-02-16 DIAGNOSIS — N183 Chronic kidney disease, stage 3 unspecified: Secondary | ICD-10-CM | POA: Diagnosis present

## 2012-02-16 DIAGNOSIS — J189 Pneumonia, unspecified organism: Secondary | ICD-10-CM

## 2012-02-16 DIAGNOSIS — Z8673 Personal history of transient ischemic attack (TIA), and cerebral infarction without residual deficits: Secondary | ICD-10-CM

## 2012-02-16 DIAGNOSIS — N289 Disorder of kidney and ureter, unspecified: Secondary | ICD-10-CM | POA: Diagnosis present

## 2012-02-16 DIAGNOSIS — G08 Intracranial and intraspinal phlebitis and thrombophlebitis: Secondary | ICD-10-CM

## 2012-02-16 DIAGNOSIS — J449 Chronic obstructive pulmonary disease, unspecified: Secondary | ICD-10-CM | POA: Diagnosis present

## 2012-02-16 DIAGNOSIS — R109 Unspecified abdominal pain: Secondary | ICD-10-CM | POA: Diagnosis present

## 2012-02-16 DIAGNOSIS — E7800 Pure hypercholesterolemia, unspecified: Secondary | ICD-10-CM | POA: Diagnosis present

## 2012-02-16 DIAGNOSIS — G459 Transient cerebral ischemic attack, unspecified: Secondary | ICD-10-CM | POA: Insufficient documentation

## 2012-02-16 DIAGNOSIS — Z950 Presence of cardiac pacemaker: Secondary | ICD-10-CM

## 2012-02-16 DIAGNOSIS — F0390 Unspecified dementia without behavioral disturbance: Secondary | ICD-10-CM

## 2012-02-16 DIAGNOSIS — K5792 Diverticulitis of intestine, part unspecified, without perforation or abscess without bleeding: Secondary | ICD-10-CM

## 2012-02-16 DIAGNOSIS — I129 Hypertensive chronic kidney disease with stage 1 through stage 4 chronic kidney disease, or unspecified chronic kidney disease: Secondary | ICD-10-CM | POA: Diagnosis present

## 2012-02-16 DIAGNOSIS — K219 Gastro-esophageal reflux disease without esophagitis: Secondary | ICD-10-CM | POA: Diagnosis present

## 2012-02-16 DIAGNOSIS — K5732 Diverticulitis of large intestine without perforation or abscess without bleeding: Principal | ICD-10-CM | POA: Diagnosis present

## 2012-02-16 LAB — COMPREHENSIVE METABOLIC PANEL
ALT: 17 U/L (ref 0–53)
AST: 30 U/L (ref 0–37)
Albumin: 3.2 g/dL — ABNORMAL LOW (ref 3.5–5.2)
Calcium: 8.9 mg/dL (ref 8.4–10.5)
Sodium: 139 mEq/L (ref 135–145)
Total Protein: 7 g/dL (ref 6.0–8.3)

## 2012-02-16 LAB — CBC WITH DIFFERENTIAL/PLATELET
Basophils Absolute: 0 10*3/uL (ref 0.0–0.1)
Eosinophils Absolute: 0 10*3/uL (ref 0.0–0.7)
Eosinophils Relative: 0 % (ref 0–5)
Lymphocytes Relative: 12 % (ref 12–46)
MCH: 28.4 pg (ref 26.0–34.0)
MCV: 87.3 fL (ref 78.0–100.0)
Platelets: 110 10*3/uL — ABNORMAL LOW (ref 150–400)
RDW: 14.2 % (ref 11.5–15.5)
WBC: 12.2 10*3/uL — ABNORMAL HIGH (ref 4.0–10.5)

## 2012-02-16 LAB — LACTIC ACID, PLASMA: Lactic Acid, Venous: 1.8 mmol/L (ref 0.5–2.2)

## 2012-02-16 LAB — URINE MICROSCOPIC-ADD ON

## 2012-02-16 LAB — URINALYSIS, ROUTINE W REFLEX MICROSCOPIC
Glucose, UA: NEGATIVE mg/dL
Specific Gravity, Urine: 1.034 — ABNORMAL HIGH (ref 1.005–1.030)

## 2012-02-16 LAB — GLUCOSE, CAPILLARY: Glucose-Capillary: 129 mg/dL — ABNORMAL HIGH (ref 70–99)

## 2012-02-16 LAB — TROPONIN I
Troponin I: <0.3 ng/mL (ref <0.30)
Troponin I: <0.3 ng/mL (ref <0.30)

## 2012-02-16 LAB — PROTIME-INR
INR: 2.11 — ABNORMAL HIGH (ref 0.00–1.49)
Prothrombin Time: 22.8 seconds — ABNORMAL HIGH (ref 11.6–15.2)

## 2012-02-16 MED ORDER — WARFARIN SODIUM 6 MG PO TABS
6.0000 mg | ORAL_TABLET | ORAL | Status: AC
  Administered 2012-02-18: 6 mg via ORAL
  Filled 2012-02-16: qty 1

## 2012-02-16 MED ORDER — ALUM & MAG HYDROXIDE-SIMETH 200-200-20 MG/5ML PO SUSP: 30.0000 mL | Freq: Four times a day (QID) | ORAL | Status: DC | PRN

## 2012-02-16 MED ORDER — WARFARIN SODIUM 6 MG PO TABS
6.0000 mg | ORAL_TABLET | Freq: Once | ORAL | Status: AC
  Administered 2012-02-16: 6 mg via ORAL
  Filled 2012-02-16: qty 1

## 2012-02-16 MED ORDER — INSULIN ASPART 100 UNIT/ML ~~LOC~~ SOLN
0.0000 [IU] | Freq: Three times a day (TID) | SUBCUTANEOUS | Status: DC
  Administered 2012-02-20: 2 [IU] via SUBCUTANEOUS
  Administered 2012-02-21: 1 [IU] via SUBCUTANEOUS

## 2012-02-16 MED ORDER — TAMSULOSIN HCL 0.4 MG PO CAPS
0.4000 mg | ORAL_CAPSULE | Freq: Every day | ORAL | Status: DC
  Administered 2012-02-16 – 2012-02-21 (×6): 0.4 mg via ORAL
  Filled 2012-02-16 (×7): qty 1

## 2012-02-16 MED ORDER — LEVOFLOXACIN IN D5W 750 MG/150ML IV SOLN: 750.0000 mg | INTRAVENOUS | Status: DC

## 2012-02-16 MED ORDER — WARFARIN SODIUM 4 MG PO TABS
4.0000 mg | ORAL_TABLET | ORAL | Status: DC
  Administered 2012-02-17: 4 mg via ORAL
  Filled 2012-02-16 (×2): qty 1

## 2012-02-16 MED ORDER — BACID PO TABS
2.0000 | ORAL_TABLET | Freq: Three times a day (TID) | ORAL | Status: DC
  Filled 2012-02-16: qty 2

## 2012-02-16 MED ORDER — ISOSORBIDE MONONITRATE ER 30 MG PO TB24
30.0000 mg | ORAL_TABLET | Freq: Every day | ORAL | Status: DC
  Administered 2012-02-17 – 2012-02-22 (×6): 30 mg via ORAL
  Filled 2012-02-16 (×6): qty 1

## 2012-02-16 MED ORDER — SODIUM CHLORIDE 0.9 % IV SOLN
INTRAVENOUS | Status: AC
  Administered 2012-02-16: 23:00:00 via INTRAVENOUS

## 2012-02-16 MED ORDER — ONDANSETRON HCL 4 MG/2ML IJ SOLN: 4.0000 mg | Freq: Four times a day (QID) | INTRAMUSCULAR | Status: DC | PRN

## 2012-02-16 MED ORDER — LEVOTHYROXINE SODIUM 200 MCG PO TABS
200.0000 ug | ORAL_TABLET | Freq: Every day | ORAL | Status: DC
  Administered 2012-02-17 – 2012-02-22 (×6): 200 ug via ORAL
  Filled 2012-02-16 (×7): qty 1

## 2012-02-16 MED ORDER — IOHEXOL 300 MG/ML  SOLN
75.0000 mL | Freq: Once | INTRAMUSCULAR | Status: AC | PRN
  Administered 2012-02-16: 75 mL via INTRAVENOUS

## 2012-02-16 MED ORDER — SODIUM CHLORIDE 0.9 % IV BOLUS (SEPSIS)
500.0000 mL | Freq: Once | INTRAVENOUS | Status: AC
  Administered 2012-02-16: 500 mL via INTRAVENOUS

## 2012-02-16 MED ORDER — SODIUM CHLORIDE 0.9 % IJ SOLN
3.0000 mL | Freq: Two times a day (BID) | INTRAMUSCULAR | Status: DC
  Administered 2012-02-16 – 2012-02-21 (×4): 3 mL via INTRAVENOUS

## 2012-02-16 MED ORDER — GLIPIZIDE 5 MG PO TABS
5.0000 mg | ORAL_TABLET | Freq: Two times a day (BID) | ORAL | Status: DC
  Administered 2012-02-17: 5 mg via ORAL
  Filled 2012-02-16 (×3): qty 1

## 2012-02-16 MED ORDER — IOHEXOL 300 MG/ML  SOLN: 20.0000 mL | INTRAMUSCULAR | Status: DC

## 2012-02-16 MED ORDER — METOPROLOL TARTRATE 25 MG PO TABS
25.0000 mg | ORAL_TABLET | Freq: Two times a day (BID) | ORAL | Status: DC
  Administered 2012-02-16 – 2012-02-22 (×12): 25 mg via ORAL
  Filled 2012-02-16 (×13): qty 1

## 2012-02-16 MED ORDER — ONDANSETRON HCL 4 MG PO TABS: 4.0000 mg | ORAL_TABLET | Freq: Four times a day (QID) | ORAL | Status: DC | PRN

## 2012-02-16 MED ORDER — WARFARIN - PHYSICIAN DOSING INPATIENT
Freq: Every day | Status: DC
  Administered 2012-02-17 – 2012-02-18 (×2)

## 2012-02-16 MED ORDER — LEVOFLOXACIN IN D5W 750 MG/150ML IV SOLN
750.0000 mg | Freq: Once | INTRAVENOUS | Status: AC
  Administered 2012-02-16: 750 mg via INTRAVENOUS
  Filled 2012-02-16: qty 150

## 2012-02-16 MED ORDER — HYDROCODONE-ACETAMINOPHEN 5-325 MG PO TABS
1.0000 | ORAL_TABLET | ORAL | Status: DC | PRN
  Administered 2012-02-16 – 2012-02-17 (×4): 1 via ORAL
  Administered 2012-02-18 – 2012-02-20 (×7): 2 via ORAL
  Filled 2012-02-16 (×2): qty 2
  Filled 2012-02-16: qty 1
  Filled 2012-02-16 (×4): qty 2
  Filled 2012-02-16 (×2): qty 1
  Filled 2012-02-16 (×2): qty 2

## 2012-02-16 MED ORDER — VANCOMYCIN HCL 10 G IV SOLR
1250.0000 mg | INTRAVENOUS | Status: DC
  Administered 2012-02-16: 1250 mg via INTRAVENOUS
  Filled 2012-02-16: qty 1250

## 2012-02-16 MED ORDER — PANTOPRAZOLE SODIUM 40 MG PO TBEC
40.0000 mg | DELAYED_RELEASE_TABLET | Freq: Every day | ORAL | Status: DC
  Administered 2012-02-17 – 2012-02-22 (×6): 40 mg via ORAL
  Filled 2012-02-16 (×6): qty 1

## 2012-02-16 NOTE — ED Notes (Signed)
EMS reports pt with chest pain starting today, starting cipro and amox for  Kidney infection yesterday, pain achy "hurts so bad," non radiating, no change with palp or movement, denies N/V/D/SOB

## 2012-02-16 NOTE — Progress Notes (Signed)
ANTIBIOTIC CONSULT NOTE - INITIAL  Pharmacy Consult for Vancocin and Levaquin Indication: positive blood culture  Allergies  Allergen Reactions  . Morphine And Related Other (See Comments)    unknown    Patient Measurements: Height: 5\' 11"  (180.3 cm) Weight: 179 lb 14.3 oz (81.6 kg) IBW/kg (Calculated) : 75.3   Vital Signs: Temp: 99 F (37.2 C) (12/21 2200) Temp src: Oral (12/21 1557) BP: 132/75 mmHg (12/21 2200) Pulse Rate: 80  (12/21 2200)  Labs:  Basename 02/16/12 1648  WBC 12.2*  HGB 13.2  PLT 110*  LABCREA --  CREATININE 1.58*   Estimated Creatinine Clearance: 34.4 ml/min (by C-G formula based on Cr of 1.58).   Microbiology: No results found for this or any previous visit (from the past 720 hour(s)).  Medical History: Past Medical History  Diagnosis Date  . Hypertension   . Diabetes mellitus   . TIA (transient ischemic attack)   . Pacemaker   . COPD (chronic obstructive pulmonary disease)   . High cholesterol   . Pacemaker   . Senile dementia   . Intracranial bleed   . MI (myocardial infarction)   . GERD (gastroesophageal reflux disease)   . Coronary artery disease     Medications:  Prescriptions prior to admission  Medication Sig Dispense Refill  . albuterol-ipratropium (COMBIVENT) 18-103 MCG/ACT inhaler Inhale 2 puffs into the lungs every 4 (four) hours as needed. For wheezing.      Marland Kitchen amoxicillin-clavulanate (AUGMENTIN) 875-125 MG per tablet Take 1 tablet by mouth 2 (two) times daily.      . ciprofloxacin (CIPRO) 500 MG tablet Take 500 mg by mouth 2 (two) times daily.      Marland Kitchen docusate sodium (COLACE) 100 MG capsule Take 100 mg by mouth 2 (two) times daily as needed. For constipation      . Fe Fum-FA-B Cmp-C-Zn-Mg-Mn-Cu (HEMOCYTE PLUS) 106-1 MG CAPS Take 1 tablet by mouth daily.      Marland Kitchen glipiZIDE (GLUCOTROL) 5 MG tablet Take 5 mg by mouth 2 (two) times daily before a meal.      . isosorbide mononitrate (IMDUR) 30 MG 24 hr tablet Take 30 mg by mouth  daily.      Marland Kitchen lactobacillus acidophilus (BACID) TABS Take 2 tablets by mouth 3 (three) times daily.      Marland Kitchen levothyroxine (SYNTHROID, LEVOTHROID) 200 MCG tablet Take 200 mcg by mouth daily.      Marland Kitchen lovastatin (MEVACOR) 10 MG tablet Take 10 mg by mouth at bedtime.      . metoprolol tartrate (LOPRESSOR) 25 MG tablet Take 25 mg by mouth 2 (two) times daily.      . mupirocin cream (BACTROBAN) 2 % Apply 1 application topically 3 (three) times daily.      . nitroGLYCERIN (NITROSTAT) 0.4 MG SL tablet Place 0.4 mg under the tongue every 5 (five) minutes as needed. For chest pain.      Marland Kitchen omeprazole (PRILOSEC) 40 MG capsule Take 40 mg by mouth daily.      . Tamsulosin HCl (FLOMAX) 0.4 MG CAPS Take 0.4 mg by mouth at bedtime.      Marland Kitchen warfarin (COUMADIN) 4 MG tablet Take 4-6 mg by mouth daily. Take 4 mg one day, 6 mg one day, and alternate       Scheduled:    . glipiZIDE  5 mg Oral BID AC  . insulin aspart  0-9 Units Subcutaneous TID WC  . isosorbide mononitrate  30 mg Oral Daily  . lactobacillus  acidophilus  2 tablet Oral TID  . [COMPLETED] levofloxacin (LEVAQUIN) IV  750 mg Intravenous Once  . levofloxacin (LEVAQUIN) IV  750 mg Intravenous Q48H  . levothyroxine  200 mcg Oral QAC breakfast  . metoprolol tartrate  25 mg Oral BID  . pantoprazole  40 mg Oral Daily  . [COMPLETED] sodium chloride  500 mL Intravenous Once  . sodium chloride  3 mL Intravenous Q12H  . Tamsulosin HCl  0.4 mg Oral QHS  . vancomycin  1,250 mg Intravenous Q24H  . warfarin  4 mg Oral Q48H  . warfarin  6 mg Oral Once  . warfarin  6 mg Oral Q48H  . Warfarin - Physician Dosing Inpatient   Does not apply q1800  . [DISCONTINUED] iohexol  20 mL Oral Q1 Hr x 2    Assessment: 79yo male had gone to OSH yesterday with cough and fever, Dx'd with bronchitis and sent home on PO ABX, called back today due to gram-negative organism growing in blood Cx, to begin IV ABX.  Goal of Therapy:  Vancomycin trough level 15-20 mcg/ml  Plan:   Will begin vancomycin 1250mg  IV Q24H and Levaquin 750mg  IV Q48H and monitor CBC, Cx, levels prn.  Colleen Can PharmD BCPS 02/16/2012,10:57 PM

## 2012-02-16 NOTE — ED Notes (Signed)
Medical record form signed by daughter to get records from [place].

## 2012-02-16 NOTE — ED Notes (Signed)
Diaper wet with urine. Changed.  Urinal held for pt until he was able to void. UA sent

## 2012-02-16 NOTE — H&P (Signed)
Chief Complaint:  Positive blood cx at [place]   HPI: 78 yo male h/o dementia, tia, pacer, cad went to [facility] ED yesterday for cough, fever and was diagnosed with bronchitis, cxr was neg and was given augmentin.  Pt went home and today his dtr was called back because his blood culture from yesterday was growing gram negative organism and cipro was called in for them and they were told to go back to ED, and they came to this ED.  Pt has been complaining of pain all over.  No n/v/d.  He is demented and can answer yes/no questions but not reliable and dtr has left.  His wbc yest was 6 and today is 12.    Review of Systems:  O/w unobtainable   Past Medical History: Past Medical History  Diagnosis Date  . Hypertension   . Diabetes mellitus   . TIA (transient ischemic attack)   . Pacemaker   . COPD (chronic obstructive pulmonary disease)   . High cholesterol   . Pacemaker   . Senile dementia   . Intracranial bleed   . MI (myocardial infarction)   . GERD (gastroesophageal reflux disease)   . Coronary artery disease    Past Surgical History  Procedure Date  . Pacemaker placement   . Appendectomy   . Cholecystectomy   . Hemilaminectomy     with disc removal one lumbar interspace    Medications: Prior to Admission medications   Medication Sig Start Date End Date Taking? Authorizing Provider  albuterol-ipratropium (COMBIVENT) 18-103 MCG/ACT inhaler Inhale 2 puffs into the lungs every 4 (four) hours as needed. For wheezing.   Yes Historical Provider, MD  amoxicillin-clavulanate (AUGMENTIN) 875-125 MG per tablet Take 1 tablet by mouth 2 (two) times daily. 02/16/12 02/23/12 Yes Historical Provider, MD  ciprofloxacin (CIPRO) 500 MG tablet Take 500 mg by mouth 2 (two) times daily. 02/16/12 02/27/12 Yes Historical Provider, MD  docusate sodium (COLACE) 100 MG capsule Take 100 mg by mouth 2 (two) times daily as needed. For constipation   Yes Historical Provider, MD  Fe Fum-FA-B  Cmp-C-Zn-Mg-Mn-Cu (HEMOCYTE PLUS) 106-1 MG CAPS Take 1 tablet by mouth daily.   Yes Historical Provider, MD  glipiZIDE (GLUCOTROL) 5 MG tablet Take 5 mg by mouth 2 (two) times daily before a meal.   Yes Historical Provider, MD  isosorbide mononitrate (IMDUR) 30 MG 24 hr tablet Take 30 mg by mouth daily.   Yes Historical Provider, MD  lactobacillus acidophilus (BACID) TABS Take 2 tablets by mouth 3 (three) times daily.   Yes Historical Provider, MD  levothyroxine (SYNTHROID, LEVOTHROID) 200 MCG tablet Take 200 mcg by mouth daily.   Yes Historical Provider, MD  lovastatin (MEVACOR) 10 MG tablet Take 10 mg by mouth at bedtime.   Yes Historical Provider, MD  metoprolol tartrate (LOPRESSOR) 25 MG tablet Take 25 mg by mouth 2 (two) times daily.   Yes Historical Provider, MD  mupirocin cream (BACTROBAN) 2 % Apply 1 application topically 3 (three) times daily.   Yes Historical Provider, MD  nitroGLYCERIN (NITROSTAT) 0.4 MG SL tablet Place 0.4 mg under the tongue every 5 (five) minutes as needed. For chest pain.   Yes Historical Provider, MD  omeprazole (PRILOSEC) 40 MG capsule Take 40 mg by mouth daily.   Yes Historical Provider, MD  Tamsulosin HCl (FLOMAX) 0.4 MG CAPS Take 0.4 mg by mouth at bedtime.   Yes Historical Provider, MD  warfarin (COUMADIN) 4 MG tablet Take 4-6 mg by mouth daily.  Take 4 mg one day, 6 mg one day, and alternate   Yes Historical Provider, MD    Allergies:   Allergies  Allergen Reactions  . Morphine And Related Other (See Comments)    unknown    Social History:  reports that he has quit smoking. He does not have any smokeless tobacco history on file. He reports that he does not drink alcohol or use illicit drugs.  Family History: History reviewed. No pertinent family history.  Physical Exam: Filed Vitals:   02/16/12 1845 02/16/12 1849 02/16/12 2000 02/16/12 2030  BP: 145/76 145/76 159/62 142/76  Pulse: 80 80 80 84  Temp:      TempSrc:      Resp: 23 14 23 23   SpO2:  100% 97% 99% 99%   General appearance: alert, cooperative and no distress  Clinically dehydrated Neck: no JVD and supple, symmetrical, trachea midline Lungs: clear to auscultation bilaterally Heart: regular rate and rhythm, S1, S2 normal, no murmur, click, rub or gallop Abdomen: soft, non-tender; bowel sounds normal; no masses,  no organomegaly Extremities: extremities normal, atraumatic, no cyanosis or edema Pulses: 2+ and symmetric Skin: Skin color, texture, turgor normal. No rashes or lesions Neurologic: Grossly normal    Labs on Admission:   [facility] 02/16/12 1648  NA 139  K 4.1  CL 105  CO2 20  GLUCOSE 125*  BUN 32*  CREATININE 1.58*  CALCIUM 8.9  MG --  PHOS --    Basename 02/16/12 1648  AST 30  ALT 17  ALKPHOS 111  BILITOT 0.5  PROT 7.0  ALBUMIN 3.2*    Basename 02/16/12 1648  LIPASE 9*  AMYLASE --    Basename 02/16/12 1648  WBC 12.2*  NEUTROABS 9.7*  HGB 13.2  HCT 40.5  MCV 87.3  PLT 110*    Basename 02/16/12 1651  CKTOTAL --  CKMB --  CKMBINDEX --  TROPONINI <0.30    Radiological Exams on Admission: Ct Abdomen Pelvis W Contrast  02/16/2012  *RADIOLOGY REPORT*  Clinical Data: Chest pain, kidney infection, diffuse abdominal pain, past history of COPD, coronary disease post MI, dementia, appendectomy, cholecystectomy, GERD  CT ABDOMEN AND PELVIS WITH CONTRAST  Technique:  Multidetector CT imaging of the abdomen and pelvis was performed following the standard protocol during bolus administration of intravenous contrast. Sagittal and coronal MPR images reconstructed from axial data set.  Contrast: 75mL OMNIPAQUE IOHEXOL 300 MG/ML  SOLN Dilute oral contrast.  Comparison: None  Findings: Pacemaker lead right ventricle. Minimal pericardial effusion. Tiny bibasilar pleural effusions with minimal adjacent lower lobe atelectasis. Prior cholecystectomy. Bilateral renal cortical atrophy with small cyst at inferior pole left kidney. No renal mass or  hydronephrosis.  Liver, spleen, pancreas, and adrenal glands grossly normal appearance. Scattered respiratory motion artifacts. Duodenal diverticulum adjacent to second portion of duodenum. 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 Unremarkable bladder and ureters.  Extensive diverticulosis of the colon greatest at sigmoid colon without CT evidence of acute diverticulitis. Stomach and bowel loops otherwise normal appearance for degree of motion. No definite mass, adenopathy, free fluid, or inflammatory process. No hernia or free air. Bones demineralized.  IMPRESSION: Extensive colonic diverticulosis. Minimal pericardial and bilateral pleural effusion. Bilateral renal cortical atrophy. 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 No definite acute intra abdominal or pelvic abnormalities.   Original Report Authenticated By: Ulyses Southward, M.D.    Dg Chest  Port 1 View  02/16/2012  *RADIOLOGY REPORT*  Clinical Data: Chest pain, cough, congestion, hypertension, diabetes, COPD, coronary disease, former smoker  PORTABLE CHEST - 1 VIEW  Comparison: Portable exam 1649 hours compared to 07/12/2011  Findings: Patient's chin obscures the medial lung apices. Right subclavian sequential transveous pacemaker leads project at right atrium and right ventricle. Minimal enlargement of cardiac silhouette. Tortuous aorta. Pulmonary vascularity normal for technique. Calcified granulomata at left apex. Bronchitic changes with question infiltrate versus atelectasis at right lung base. No pleural effusion or pneumothorax. Bones demineralized.  IMPRESSION: Question mild atelectasis versus infiltrate at right base. Bronchitic and old granulomas disease changes.   Original Report Authenticated By: Ulyses Southward, M.D.     Assessment/Plan 78 yo male with gram neg bacteremia from outside blood culture [facility]  ED without a clear source  Principal Problem:  *Bacteremia due to Gram-negative bacteria Active Problems:  Pacemaker  Senile dementia  Coronary artery disease  h/o TIA (transient ischemic attack)  Hypertension  Abdominal pain  Chest pain  Renal insufficiency, mild  cxr is neg.  ua is neg yesterday and repeat one today is pending.  Repeat bc done.  Place on vanc/levaquin for now.  abd exam is benign and ct abd pelvis done here shows no acute issues.  ivf overnight.  Presumed full code.    Les Longmore A 02/16/2012, 8:55 PM

## 2012-02-16 NOTE — ED Notes (Signed)
Called CT and informed EDP about pt being unable to drink all of contrast.

## 2012-02-16 NOTE — ED Provider Notes (Signed)
History     CSN: [id number]  Arrival date & time 02/16/12  1549   First MD Initiated Contact with Patient 02/16/12 1552      Chief Complaint  Patient presents with  . Chest Pain    (Consider location/radiation/quality/duration/timing/severity/associated sxs/prior treatment) HPI Pt is a very poor historian. Complains of pain all over. History of dementia. Daughter states he has been c/o chest and abd pain and was taken to [facility] ER yesterday. Workup was negative and pt was sent home. Received call back from hospital stating pt had positive blood culture and to come to the ER.  Past Medical History  Diagnosis Date  . Hypertension   . Diabetes mellitus   . TIA (transient ischemic attack)   . Pacemaker   . COPD (chronic obstructive pulmonary disease)   . High cholesterol   . Pacemaker   . Senile dementia   . Intracranial bleed   . MI (myocardial infarction)   . GERD (gastroesophageal reflux disease)   . Coronary artery disease     Past Surgical History  Procedure Date  . Pacemaker placement   . Appendectomy   . Cholecystectomy   . Hemilaminectomy     with disc removal one lumbar interspace    History reviewed. No pertinent family history.  History  Substance Use Topics  . Smoking status: Former Games developer  . Smokeless tobacco: Not on file  . Alcohol Use: No      Review of Systems  Unable to perform ROS: Dementia    Allergies  Morphine and related  Home Medications   Current Outpatient Rx  Name  Route  Sig  Dispense  Refill  . IPRATROPIUM-ALBUTEROL 18-103 MCG/ACT IN AERO   Inhalation   Inhale 2 puffs into the lungs every 4 (four) hours as needed. For wheezing.         Marland Kitchen AMOXICILLIN-POT CLAVULANATE 875-125 MG PO TABS   Oral   Take 1 tablet by mouth 2 (two) times daily.         Marland Kitchen CIPROFLOXACIN HCL 500 MG PO TABS   Oral   Take 500 mg by mouth 2 (two) times daily.         Marland Kitchen DOCUSATE SODIUM 100 MG PO CAPS   Oral   Take 100 mg by mouth 2  (two) times daily as needed. For constipation         . HEMOCYTE PLUS 106-1 MG PO CAPS   Oral   Take 1 tablet by mouth daily.         Marland Kitchen GLIPIZIDE 5 MG PO TABS   Oral   Take 5 mg by mouth 2 (two) times daily before a meal.         . ISOSORBIDE MONONITRATE ER 30 MG PO TB24   Oral   Take 30 mg by mouth daily.         Marland Kitchen BACID PO TABS   Oral   Take 2 tablets by mouth 3 (three) times daily.         Marland Kitchen LEVOTHYROXINE SODIUM 200 MCG PO TABS   Oral   Take 200 mcg by mouth daily.         Marland Kitchen LOVASTATIN 10 MG PO TABS   Oral   Take 10 mg by mouth at bedtime.         Marland Kitchen METOPROLOL TARTRATE 25 MG PO TABS   Oral   Take 25 mg by mouth 2 (two) times daily.         Marland Kitchen  MUPIROCIN CALCIUM 2 % EX CREA   Topical   Apply 1 application topically 3 (three) times daily.         Marland Kitchen NITROGLYCERIN 0.4 MG SL SUBL   Sublingual   Place 0.4 mg under the tongue every 5 (five) minutes as needed. For chest pain.         Marland Kitchen OMEPRAZOLE 40 MG PO CPDR   Oral   Take 40 mg by mouth daily.         Marland Kitchen TAMSULOSIN HCL 0.4 MG PO CAPS   Oral   Take 0.4 mg by mouth at bedtime.         . WARFARIN SODIUM 4 MG PO TABS   Oral   Take 4-6 mg by mouth daily. Take 4 mg one day, 6 mg one day, and alternate           BP 145/76  Pulse 80  Temp 98.2 F (36.8 C) (Oral)  Resp 14  SpO2 97%  Physical Exam  Nursing note and vitals reviewed. Constitutional: He appears well-developed and well-nourished. No distress.  HENT:  Head: Normocephalic and atraumatic.       Dry MM  Eyes: EOM are normal. Pupils are equal, round, and reactive to light.  Neck: Normal range of motion. Neck supple.  Cardiovascular: Normal rate and regular rhythm.  Exam reveals no gallop and no friction rub.   No murmur heard. Pulmonary/Chest: Effort normal and breath sounds normal. No respiratory distress. He has no wheezes. He has no rales. He exhibits no tenderness.  Abdominal: Soft. Bowel sounds are normal. He exhibits no  distension and no mass. There is tenderness (Diffuse TTP. No rebound or guarding). There is no rebound and no guarding.  Musculoskeletal: Normal range of motion. He exhibits no edema and no tenderness.       No lower ext swelling or pain  Neurological: He is alert.       Moves all ext without focal deficit. Follows simple commands  Skin: Skin is warm and dry. No rash noted. No erythema.  Psychiatric: He has a normal mood and affect. His behavior is normal.    ED Course  Procedures (including critical care time)  Labs Reviewed  CBC WITH DIFFERENTIAL - Abnormal; Notable for the following:    WBC 12.2 (*)     Platelets 110 (*)     Neutrophils Relative 80 (*)     Neutro Abs 9.7 (*)     All other components within normal limits  COMPREHENSIVE METABOLIC PANEL - Abnormal; Notable for the following:    Glucose, Bld 125 (*)     BUN 32 (*)     Creatinine, Ser 1.58 (*)     Albumin 3.2 (*)     GFR calc non Af Amer 37 (*)     GFR calc Af Amer 43 (*)     All other components within normal limits  LIPASE, BLOOD - Abnormal; Notable for the following:    Lipase 9 (*)     All other components within normal limits  PROTIME-INR - Abnormal; Notable for the following:    Prothrombin Time 22.8 (*)     INR 2.11 (*)     All other components within normal limits  APTT - Abnormal; Notable for the following:    aPTT 54 (*)     All other components within normal limits  TROPONIN I  URINALYSIS, ROUTINE W REFLEX MICROSCOPIC  CULTURE, BLOOD (ROUTINE X 2)  CULTURE, BLOOD (ROUTINE  X 2)   Ct Abdomen Pelvis W Contrast  02/16/2012  *RADIOLOGY REPORT*  Clinical Data: Chest pain, kidney infection, diffuse abdominal pain, past history of COPD, coronary disease post MI, dementia, appendectomy, cholecystectomy, GERD  CT ABDOMEN AND PELVIS WITH CONTRAST  Technique:  Multidetector CT imaging of the abdomen and pelvis was performed following the standard protocol during bolus administration of intravenous contrast.  Sagittal and coronal MPR images reconstructed from axial data set.  Contrast: 75mL OMNIPAQUE IOHEXOL 300 MG/ML  SOLN Dilute oral contrast.  Comparison: None  Findings: Pacemaker lead right ventricle. Minimal pericardial effusion. Tiny bibasilar pleural effusions with minimal adjacent lower lobe atelectasis. Prior cholecystectomy. Bilateral renal cortical atrophy with small cyst at inferior pole left kidney. No renal mass or hydronephrosis.  Liver, spleen, pancreas, and adrenal glands grossly normal appearance. Scattered respiratory motion artifacts. Duodenal diverticulum adjacent to second portion of duodenum. 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 Unremarkable bladder and ureters.  Extensive diverticulosis of the colon greatest at sigmoid colon without CT evidence of acute diverticulitis. Stomach and bowel loops otherwise normal appearance for degree of motion. No definite mass, adenopathy, free fluid, or inflammatory process. No hernia or free air. Bones demineralized.  IMPRESSION: Extensive colonic diverticulosis. Minimal pericardial and bilateral pleural effusion. Bilateral renal cortical atrophy. 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 No definite acute intra abdominal or pelvic abnormalities.   Original Report Authenticated By: Ulyses Southward, M.D.    Dg Chest Port 1 View  02/16/2012  *RADIOLOGY REPORT*  Clinical Data: Chest pain, cough, congestion, hypertension, diabetes, COPD, coronary disease, former smoker  PORTABLE CHEST - 1 VIEW  Comparison: Portable exam 1649 hours compared to 07/12/2011  Findings: Patient's chin obscures the medial lung apices. Right subclavian sequential transveous pacemaker leads project at right atrium and right ventricle. Minimal enlargement of cardiac silhouette. Tortuous aorta. Pulmonary vascularity normal for technique. Calcified  granulomata at left apex. Bronchitic changes with question infiltrate versus atelectasis at right lung base. No pleural effusion or pneumothorax. Bones demineralized.  IMPRESSION: Question mild atelectasis versus infiltrate at right base. Bronchitic and old granulomas disease changes.   Original Report Authenticated By: Ulyses Southward, M.D.      1. Community acquired pneumonia   2. Leukocytosis   3. Chest pain      Date: 02/16/2012  Rate: 80  Rhythm: AV paced  QRS Axis: indeterminate  Intervals: normal  ST/T Wave abnormalities: indeterminate  Conduction Disutrbances:none  Narrative Interpretation:   Old EKG Reviewed: none available    MDM  Discussed with Dr Onalee Hua. Will admit.        Loren Racer, MD 02/16/12 2047

## 2012-02-17 DIAGNOSIS — J189 Pneumonia, unspecified organism: Secondary | ICD-10-CM

## 2012-02-17 DIAGNOSIS — I10 Essential (primary) hypertension: Secondary | ICD-10-CM

## 2012-02-17 DIAGNOSIS — G08 Intracranial and intraspinal phlebitis and thrombophlebitis: Secondary | ICD-10-CM

## 2012-02-17 DIAGNOSIS — Z950 Presence of cardiac pacemaker: Secondary | ICD-10-CM

## 2012-02-17 LAB — BASIC METABOLIC PANEL
BUN: 28 mg/dL — ABNORMAL HIGH (ref 6–23)
BUN: 29 mg/dL — ABNORMAL HIGH (ref 6–23)
Calcium: 8.5 mg/dL (ref 8.4–10.5)
Chloride: 106 mEq/L (ref 96–112)
Chloride: 108 mEq/L (ref 96–112)
Creatinine, Ser: 1.51 mg/dL — ABNORMAL HIGH (ref 0.50–1.35)
GFR calc Af Amer: 50 mL/min — ABNORMAL LOW (ref >90)
GFR calc Af Amer: 46 mL/min — ABNORMAL LOW (ref >90)
GFR calc non Af Amer: 43 mL/min — ABNORMAL LOW (ref >90)
GFR calc non Af Amer: 39 mL/min — ABNORMAL LOW (ref >90)
Potassium: 3.8 mEq/L (ref 3.5–5.1)
Sodium: 135 mEq/L (ref 135–145)

## 2012-02-17 LAB — CBC
HCT: 35.3 % — ABNORMAL LOW (ref 39.0–52.0)
Hemoglobin: 11.6 g/dL — ABNORMAL LOW (ref 13.0–17.0)
RBC: 4.07 MIL/uL — ABNORMAL LOW (ref 4.22–5.81)
WBC: 9 10*3/uL (ref 4.0–10.5)

## 2012-02-17 LAB — CBC WITH DIFFERENTIAL/PLATELET
Basophils Absolute: 0 10*3/uL (ref 0.0–0.1)
Basophils Relative: 0 % (ref 0–1)
HCT: 33.7 % — ABNORMAL LOW (ref 39.0–52.0)
MCHC: 33.5 g/dL (ref 30.0–36.0)
Monocytes Absolute: 0.5 10*3/uL (ref 0.1–1.0)
Neutro Abs: 5.9 10*3/uL (ref 1.7–7.7)
Platelets: 95 10*3/uL — ABNORMAL LOW (ref 150–400)
RDW: 14.4 % (ref 11.5–15.5)
WBC: 7.8 10*3/uL (ref 4.0–10.5)

## 2012-02-17 LAB — DIC (DISSEMINATED INTRAVASCULAR COAGULATION)PANEL
Prothrombin Time: 28.5 seconds — ABNORMAL HIGH (ref 11.6–15.2)
Smear Review: NONE SEEN
aPTT: 68 seconds — ABNORMAL HIGH (ref 24–37)

## 2012-02-17 LAB — PROTIME-INR
INR: 2.52 — ABNORMAL HIGH (ref 0.00–1.49)
Prothrombin Time: 26 seconds — ABNORMAL HIGH (ref 11.6–15.2)

## 2012-02-17 LAB — GLUCOSE, CAPILLARY
Glucose-Capillary: 92 mg/dL (ref 70–99)
Glucose-Capillary: 70 mg/dL (ref 70–99)
Glucose-Capillary: 121 mg/dL — ABNORMAL HIGH (ref 70–99)

## 2012-02-17 LAB — MRSA PCR SCREENING: MRSA by PCR: NEGATIVE

## 2012-02-17 LAB — TECHNOLOGIST SMEAR REVIEW

## 2012-02-17 LAB — LACTIC ACID, PLASMA: Lactic Acid, Venous: 0.6 mmol/L (ref 0.5–2.2)

## 2012-02-17 LAB — TROPONIN I: Troponin I: <0.3 ng/mL (ref <0.30)

## 2012-02-17 MED ORDER — DEXTROSE-NACL 5-0.45 % IV SOLN
INTRAVENOUS | Status: DC
  Administered 2012-02-17: 17:00:00 via INTRAVENOUS

## 2012-02-17 MED ORDER — RISAQUAD PO CAPS
2.0000 | ORAL_CAPSULE | Freq: Three times a day (TID) | ORAL | Status: DC
  Administered 2012-02-17 – 2012-02-22 (×16): 2 via ORAL
  Filled 2012-02-17 (×19): qty 2

## 2012-02-17 MED ORDER — PIPERACILLIN-TAZOBACTAM 3.375 G IVPB
3.3750 g | Freq: Three times a day (TID) | INTRAVENOUS | Status: DC
Start: 2012-02-17 — End: 2012-02-18
  Administered 2012-02-17 – 2012-02-18 (×5): 3.375 g via INTRAVENOUS
  Filled 2012-02-17 (×6): qty 50

## 2012-02-17 NOTE — Progress Notes (Addendum)
[facility] PROGRESS NOTE  [name] WUJ:[id number] DOB: 1921/12/22 DOA: 02/16/2012 PCP: Pcp Not In System  Assessment/Plan:  GNR bacteremia:  Source may be from pneumonia seen on CXR.  Per daughter, the patient has had severe episodes of bacteremia with various organisms, including pseudomonas about one year ago.  According to daughter, the pseudomonas bacteremia was attributed to a PICC line and although there was discussion about removing the patient's pacemaker as a possible nidus of infection, the cardiologist felt that the patient would not survive the procedure.  He has also reportedly had E. Coli or Klebsiella bacteremia.   -  [place] [facility] should have speciation on 12/23:  [phone] -  Monotherapy zosyn, patient hemodynamically stable. -  If unstable, please escalate to a penem and add vanc -  If pseudomonas, will consult ID and cardiology  -  If less tenacious organism, will treat for 7 days  Abdominal pain, likely diverticulitis:  TTP diffusely, but particularly in the left lower quadrant.  Reviewed CT scan from 12/21 with Dr. Chilton Si from radiology who sees some early stranding in the same area suggestive of diverticulitis.  Mesenteric vessels appear patent.   -  Continue zosyn -  Consider reimaging in a few days if not improving to rule out abscess -  CLD pending improvement in abdominal pain  Anemia, mild normocytic anemia, chronic and hgb at baseline of 11-12 for patient Thrombocytopenia, worsening:  Concern for DIC in the setting of sepsis.  Patient has not received heparin-like products.  Creatinine at baseline.  -  DIC panel neg (fibrogen elevated) -  F/u smear  Elevated D-dimer:  Likely acute phase reactant in the setting of sepsis/diverticulitis.  Patient is on therapeutic coumadin and therefore is lower risk for PE.  Additionally, patient asymptomatic.   -  If signs and symptoms of PE, consider V/Q -  Continue therapeutic   Nongap metabolic acidosis.   No diarrhea and kidney function at baseline.  Concern for early gap metabolic acidosis. -  Repeat BMP demonstrates marginal improvement -  Lactic acid neg -  Consider changing fluids to sodium bicarb  CKD, stage 3, creatinine at baseline.   -  Avoid nephrotoxins -  At risk for CIN after recent contrasted study  COPD, stable.   CAD with chest pain:  troponins neg and ECG stable.  Patient not on aspirin or plavix.  + BB, no statin HTN/HLD:  BP stable.  Cont. metoprolol DM:  fingersticks trending down.  Discontinue glipizide, start dextrose containing fluids GERD:  Stable.   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  INR is therapeutic -  Outpatient follow up with Neurology Hypothyroidism:  TSH. Continue synthroid  DIET:  Clear liquid ACCESS:  PIV IVF:  NS at 109ml/h PROPH:  Therapeutic warfarin  Code Status: Full code Family Communication: spoke with the patient and his daughter who was at bedside Disposition Plan: patient continues to need in hospital services.  Awaiting speciation and sensitivities of GNR bacteremia and improvement in abdominal pain.     Consultants:  None  Procedures:  CT ab/p  Antibiotics:  Vanco 12/21, then OFF  Levoflox 12/21, then OFF  Zosyn 12/22 >>   HPI/Subjective:  Patient unable to give much history due to dementia.  Daughter was at bedside today and stated that he has been complaining of diffuse chest  and abdominal pain.  He has had some mild shortness of breath and poor appetite.  Urine has been darker than usual.    Objective: Filed Vitals:   02/16/12 2130 02/16/12 2200 02/17/12 0500 02/17/12 0958  BP: 136/76 132/75 104/66 102/65  Pulse: 80 80 89 85  Temp:  99 F (37.2 C) 97.3 F (36.3 C)   TempSrc:      Resp: 22 18 18    Height:  5\' 11"  (1.803 m)    Weight:  81.6 kg  (179 lb 14.3 oz)    SpO2: 99% 96% 97%     Intake/Output Summary (Last 24 hours) at 02/17/12 1428 Last data filed at 02/17/12 1000  Gross per 24 hour  Intake    440 ml  Output    200 ml  Net    240 ml   Filed Weights   02/16/12 2200  Weight: 81.6 kg (179 lb 14.3 oz)    Exam:   General:  Average weight CM, no acute distress  HEENT:  Mildly dry MM  Cardiovascular: Normal S1, S2, no murmurs, rubs, or gallops, 2+ pulses, cool extremities  Respiratory: CTAB  Abdomen: NABS, soft, tender to palpation particularly in the LLQ but also across epigastric area.  No rebound or guarding.    MSK:  No LEE  Data Reviewed: Basic Metabolic Panel:  Lab 02/17/12 1610 02/16/12 1648  NA 135 139  K 3.8 4.1  CL 106 105  CO2 18* 20  GLUCOSE 94 125*  BUN 28* 32*  CREATININE 1.41* 1.58*  CALCIUM 8.4 8.9  MG -- --  PHOS -- --   Liver Function Tests:  Lab 02/16/12 1648  AST 30  ALT 17  ALKPHOS 111  BILITOT 0.5  PROT 7.0  ALBUMIN 3.2*    Lab 02/16/12 1648  LIPASE 9*  AMYLASE --   No results found for this basename: AMMONIA:5 in the last 168 hours CBC:  Lab 02/17/12 0514 02/16/12 1648  WBC 9.0 12.2*  NEUTROABS -- 9.7*  HGB 11.6* 13.2  HCT 35.3* 40.5  MCV 86.7 87.3  PLT 89* 110*   Cardiac Enzymes:  Lab 02/17/12 1004 02/17/12 0514 02/16/12 2232 02/16/12 1651  CKTOTAL -- -- -- --  CKMB -- -- -- --  CKMBINDEX -- -- -- --  TROPONINI <0.30 <0.30 <0.30 <0.30   BNP (last 3 results) No results found for this basename: PROBNP:3 in the last 8760 hours CBG:  Lab 02/17/12 1144 02/16/12 2220  GLUCAP 92 129*    Recent Results (from the past 240 hour(s))  MRSA PCR SCREENING     Status: Normal   Collection Time   02/16/12 11:30 PM      Component Value Range Status Comment   MRSA by PCR NEGATIVE  NEGATIVE Final      Studies: Ct Abdomen Pelvis W Contrast  02/16/2012  *RADIOLOGY REPORT*  Clinical Data: Chest pain, kidney infection, diffuse abdominal pain, past history of  COPD, coronary disease post MI, dementia, appendectomy, cholecystectomy, GERD  CT ABDOMEN AND PELVIS WITH CONTRAST  Technique:  Multidetector CT imaging of the abdomen and pelvis was performed following the standard protocol during bolus administration of intravenous contrast. Sagittal and coronal MPR images reconstructed from axial data set.  Contrast: 75mL OMNIPAQUE IOHEXOL 300 MG/ML  SOLN Dilute oral contrast.  Comparison: None  Findings: Pacemaker lead right ventricle. Minimal pericardial effusion. Tiny bibasilar pleural effusions with minimal adjacent lower lobe atelectasis. Prior cholecystectomy. Bilateral renal cortical atrophy with small cyst at inferior  pole left kidney. No renal mass or hydronephrosis.  Liver, spleen, pancreas, and adrenal glands grossly normal appearance. Scattered respiratory motion artifacts. Duodenal diverticulum adjacent to second portion of duodenum. 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 Unremarkable bladder and ureters.  Extensive diverticulosis of the colon greatest at sigmoid colon without CT evidence of acute diverticulitis. Stomach and bowel loops otherwise normal appearance for degree of motion. No definite mass, adenopathy, free fluid, or inflammatory process. No hernia or free air. Bones demineralized.  IMPRESSION: Extensive colonic diverticulosis. Minimal pericardial and bilateral pleural effusion. Bilateral renal cortical atrophy. 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 No definite acute intra abdominal or pelvic abnormalities.   Original Report Authenticated By: Ulyses Southward, M.D.    Dg Chest Port 1 View  02/16/2012  *RADIOLOGY REPORT*  Clinical Data: Chest pain, cough, congestion, hypertension, diabetes, COPD, coronary disease, former smoker  PORTABLE CHEST - 1 VIEW  Comparison: Portable exam 1649 hours compared to 07/12/2011   Findings: Patient's chin obscures the medial lung apices. Right subclavian sequential transveous pacemaker leads project at right atrium and right ventricle. Minimal enlargement of cardiac silhouette. Tortuous aorta. Pulmonary vascularity normal for technique. Calcified granulomata at left apex. Bronchitic changes with question infiltrate versus atelectasis at right lung base. No pleural effusion or pneumothorax. Bones demineralized.  IMPRESSION: Question mild atelectasis versus infiltrate at right base. Bronchitic and old granulomas disease changes.   Original Report Authenticated By: Ulyses Southward, M.D.     Scheduled Meds:   . acidophilus  2 capsule Oral TID  . glipiZIDE  5 mg Oral BID AC  . insulin aspart  0-9 Units Subcutaneous TID WC  . isosorbide mononitrate  30 mg Oral Daily  . levothyroxine  200 mcg Oral QAC breakfast  . metoprolol tartrate  25 mg Oral BID  . pantoprazole  40 mg Oral Daily  . piperacillin-tazobactam (ZOSYN)  IV  3.375 g Intravenous Q8H  . sodium chloride  3 mL Intravenous Q12H  . Tamsulosin HCl  0.4 mg Oral QHS  . warfarin  4 mg Oral Q48H  . warfarin  6 mg Oral Q48H  . Warfarin - Physician Dosing Inpatient   Does not apply q1800   Continuous Infusions:   Principal Problem:  *Bacteremia due to Gram-negative bacteria Active Problems:  Pacemaker  Senile dementia  Coronary artery disease  h/o TIA (transient ischemic attack)  Hypertension  Abdominal pain  Chest pain  Renal insufficiency, mild    Time spent: 30    Emilyann Banka, [facility]  [facility] Pager [phone]. If 8PM-8AM, please contact night-coverage at [email or web address], password [facility] 02/17/2012, 2:28 PM  LOS: 1 day

## 2012-02-18 DIAGNOSIS — G08 Intracranial and intraspinal phlebitis and thrombophlebitis: Secondary | ICD-10-CM

## 2012-02-18 DIAGNOSIS — K5792 Diverticulitis of intestine, part unspecified, without perforation or abscess without bleeding: Secondary | ICD-10-CM

## 2012-02-18 LAB — PRO B NATRIURETIC PEPTIDE: Pro B Natriuretic peptide (BNP): 7486 pg/mL — ABNORMAL HIGH (ref 0–450)

## 2012-02-18 LAB — COMPREHENSIVE METABOLIC PANEL
ALT: 12 U/L (ref 0–53)
Alkaline Phosphatase: 90 U/L (ref 39–117)
BUN: 28 mg/dL — ABNORMAL HIGH (ref 6–23)
CO2: 18 mEq/L — ABNORMAL LOW (ref 19–32)
GFR calc Af Amer: 44 mL/min — ABNORMAL LOW (ref >90)
GFR calc non Af Amer: 38 mL/min — ABNORMAL LOW (ref >90)
Glucose, Bld: 104 mg/dL — ABNORMAL HIGH (ref 70–99)
Potassium: 4 mEq/L (ref 3.5–5.1)
Sodium: 137 mEq/L (ref 135–145)
Total Bilirubin: 0.3 mg/dL (ref 0.3–1.2)

## 2012-02-18 LAB — CBC
HCT: 36.6 % — ABNORMAL LOW (ref 39.0–52.0)
Hemoglobin: 12.5 g/dL — ABNORMAL LOW (ref 13.0–17.0)
RBC: 4.29 MIL/uL (ref 4.22–5.81)

## 2012-02-18 LAB — GLUCOSE, CAPILLARY
Glucose-Capillary: 92 mg/dL (ref 70–99)
Glucose-Capillary: 105 mg/dL — ABNORMAL HIGH (ref 70–99)
Glucose-Capillary: 135 mg/dL — ABNORMAL HIGH (ref 70–99)

## 2012-02-18 MED ORDER — ALBUTEROL SULFATE (5 MG/ML) 0.5% IN NEBU
2.5000 mg | INHALATION_SOLUTION | Freq: Three times a day (TID) | RESPIRATORY_TRACT | Status: DC
  Administered 2012-02-18 – 2012-02-20 (×7): 2.5 mg via RESPIRATORY_TRACT
  Filled 2012-02-18 (×6): qty 0.5

## 2012-02-18 MED ORDER — SODIUM BICARBONATE 8.4 % IV SOLN
INTRAVENOUS | Status: DC
  Administered 2012-02-18: 10:00:00 via INTRAVENOUS
  Filled 2012-02-18 (×5): qty 50

## 2012-02-18 MED ORDER — BUDESONIDE 0.5 MG/2ML IN SUSP
0.5000 mg | Freq: Two times a day (BID) | RESPIRATORY_TRACT | Status: DC
  Administered 2012-02-18 – 2012-02-22 (×7): 0.5 mg via RESPIRATORY_TRACT
  Filled 2012-02-18 (×10): qty 2

## 2012-02-18 MED ORDER — METRONIDAZOLE IN NACL 5-0.79 MG/ML-% IV SOLN
500.0000 mg | Freq: Three times a day (TID) | INTRAVENOUS | Status: DC
  Administered 2012-02-18 – 2012-02-22 (×12): 500 mg via INTRAVENOUS
  Filled 2012-02-18 (×14): qty 100

## 2012-02-18 MED ORDER — IPRATROPIUM BROMIDE 0.02 % IN SOLN
0.5000 mg | Freq: Three times a day (TID) | RESPIRATORY_TRACT | Status: DC
  Administered 2012-02-18 – 2012-02-20 (×7): 0.5 mg via RESPIRATORY_TRACT
  Filled 2012-02-18 (×7): qty 2.5

## 2012-02-18 MED ORDER — CIPROFLOXACIN IN D5W 400 MG/200ML IV SOLN
400.0000 mg | INTRAVENOUS | Status: DC
  Administered 2012-02-18 – 2012-02-21 (×4): 400 mg via INTRAVENOUS
  Filled 2012-02-18 (×5): qty 200

## 2012-02-18 MED ORDER — ALBUTEROL SULFATE (5 MG/ML) 0.5% IN NEBU
2.5000 mg | INHALATION_SOLUTION | RESPIRATORY_TRACT | Status: DC | PRN
  Filled 2012-02-18 (×2): qty 0.5

## 2012-02-18 MED ORDER — POLYETHYLENE GLYCOL 3350 17 G PO PACK
17.0000 g | PACK | Freq: Every day | ORAL | Status: DC
  Administered 2012-02-20 – 2012-02-21 (×2): 17 g via ORAL
  Filled 2012-02-18 (×5): qty 1

## 2012-02-18 NOTE — Care Management Note (Unsigned)
    Page 1 of 1   02/18/2012     3:14:40 PM   CARE MANAGEMENT NOTE 02/18/2012  Patient:  [name], [name]   Account Number:  [id number]  Date Initiated:  02/18/2012  Documentation initiated by:  GRAVES-BIGELOW,Aqib Lough  Subjective/Objective Assessment:   Pt admitted with + blood cultures and being treated with IV ABX.     Action/Plan:   CM will continue ot monitor for disposition needs.   Anticipated DC Date:  02/21/2012   Anticipated DC Plan:  HOME W HOME HEALTH SERVICES         Choice offered to / List presented to:             Status of service:  Completed, signed off Medicare Important Message given?   (If response is "NO", the following Medicare IM given date fields will be blank) Date Medicare IM given:   Date Additional Medicare IM given:    Discharge Disposition:    Per UR Regulation:  Reviewed for med. necessity/level of care/duration of stay  If discussed at Long Length of Stay Meetings, dates discussed:    Comments:

## 2012-02-18 NOTE — Progress Notes (Signed)
[facility] PROGRESS NOTE  [name] MVH:[id number] DOB: 31-Dec-1921 DOA: 02/16/2012 PCP: Pcp Not In System  Assessment/Plan:  Raoultella planticola (previously Klebsiella sp.) bacteremia:  Most likely from diverticulitis. The patient has had severe episodes of bacteremia with various organisms, including pseudomonas about one year ago.  According to daughter, the pseudomonas bacteremia was attributed to a PICC line and although there was discussion about removing the patient's pacemaker as a possible nidus of infection, the cardiologist felt that the patient would not survive the procedure.  Raoultella is not a tenacious bacteria and is less likely to adhere to hardware.  Per Dr. Orvan Falconer, treat for diverticulitis for 14 days, no need to address pacemaker at this time.   -  Sensitivities placed on bedside chart -  Change to cipro/flagyl from zosyn  Abdominal pain, likely diverticulitis:  TTP in the left lower and right upper quadrant.  Reviewed CT scan from 12/21 with Dr. Chilton Si from radiology who sees some early stranding in the same area suggestive of diverticulitis.  Mesenteric vessels appear patent.   -  Cipro/flagyl day 3 -  Consider reimaging in a few days if not improving to rule out abscess -  CLD pending improvement in abdominal pain  Anemia, mild normocytic anemia, chronic and hgb at baseline of 11-12 for patient Thrombocytopenia, stable.  DIC labs negative and no schistocytes on smear  Elevated D-dimer:  Likely acute phase reactant in the setting of sepsis/diverticulitis.  Patient is on therapeutic coumadin and therefore is lower risk for PE.  Additionally, patient asymptomatic.   -  If signs and symptoms of PE, consider V/Q -  Continue therapeutic   Nongap metabolic acidosis.  No diarrhea and kidney function at baseline.  Concern for early gap metabolic acidosis, but lactate neg and patient is eating so less likely to be ketosis.   -  RTA?  Check urine electrolytes -  Sodium bicarb fluids gently  CKD, stage 3, creatinine at baseline.   -  Avoid nephrotoxins -  At risk for CIN after recent contrasted study  Wheezing:  May be due to heart failure versus COPD or less likely drug reaction. COPD:  Avoid steroids if possible in setting of acute infection.  Budesonide with duonebs prn ?CHF:  Pro BNP  CAD with chest pain:  troponins neg and ECG stable.  Patient not on aspirin or plavix.  + BB, no statin HTN/HLD:  BP stable.  Cont. metoprolol DM:  fingersticks stable on dextrose gtt.   GERD:  Stable.   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  INR is therapeutic -  Outpatient follow up with Neurology Hypothyroidism:  TSH. Continue synthroid  DIET:  Clear liquid ACCESS:  PIV IVF:  NS at 12ml/h PROPH:  Therapeutic warfarin  Code Status: Full code Family Communication: spoke with the patient and his daughter who was at bedside Disposition Plan: patient continues to need in hospital services.  Awaiting speciation and sensitivities of GNR bacteremia and improvement in abdominal pain.     Consultants:  None  Procedures:  CT ab/p  Antibiotics:  Vanco 12/21, then OFF  Levoflox 12/21, then OFF  Zosyn 12/22 >>   HPI/Subjective:  Patient unable to give much history due to dementia.  Daughter was at bedside today and stated that he has been complaining of diffuse chest and abdominal pain.  He has had some mild shortness of breath and poor appetite.  Eating cause pain.    Objective: Filed Vitals:   02/17/12 1400 02/17/12 2100 02/17/12 2151 02/18/12 0500  BP: 105/72 109/68 100/74 128/78  Pulse: 86 87 90 80  Temp: 98.2 F (36.8 C) 97.7 F (36.5 C)  97.6 F (36.4 C)  TempSrc:  Oral  Oral  Resp: 17 18  18   Height:      Weight:    81.4 kg (179 lb 7.3 oz)  SpO2: 96% 97%  99%     Intake/Output Summary (Last 24 hours) at 02/18/12 1413 Last data filed at 02/18/12 1202  Gross per 24 hour  Intake    153 ml  Output    850 ml  Net   -697 ml   Filed Weights   02/16/12 2200 02/18/12 0500  Weight: 81.6 kg (179 lb 14.3 oz) 81.4 kg (179 lb 7.3 oz)    Exam:   General:  Average weight CM, no acute distress  HEENT:  MMM  Cardiovascular: Normal S1, S2, no murmurs, rubs, or gallops, 2+ pulses, cool extremities  Respiratory:  Diminished bilateral breath sounds with high pitched full expiratory wheeze.  Faint rales at bilateral bases without rhonchi.  No increased WOB  Abdomen: NABS, soft, tender to palpation particularly in the LLQ  And RUQ.  No rebound or guarding.    MSK:  No LEE  Data Reviewed: Basic Metabolic Panel:  Lab 02/18/12 1610 02/17/12 1558 02/17/12 0514 02/16/12 1648  NA 137 137 135 139  K 4.0 4.1 3.8 4.1  CL 108 108 106 105  CO2 18* 19 18* 20  GLUCOSE 104* 79 94 125*  BUN 28* 29* 28* 32*  CREATININE 1.57* 1.51* 1.41* 1.58*  CALCIUM 8.6 8.5 8.4 8.9  MG -- -- -- --  PHOS -- -- -- --   Liver Function Tests:  Lab 02/18/12 0530 02/16/12 1648  AST 20 30  ALT 12 17  ALKPHOS 90 111  BILITOT 0.3 0.5  PROT 6.2 7.0  ALBUMIN 2.5* 3.2*    Lab 02/16/12 1648  LIPASE 9*  AMYLASE --   No results found for this basename: AMMONIA:5 in the last 168 hours CBC:  Lab 02/18/12 0530 02/17/12 1558 02/17/12 0514 02/16/12 1648  WBC 8.4 7.8 9.0 12.2*  NEUTROABS -- 5.9 -- 9.7*  HGB 12.5* 11.3* 11.6* 13.2  HCT 36.6* 33.7* 35.3* 40.5  MCV 85.3 86.9 86.7 87.3  PLT 88* 95*99* 89* 110*   Cardiac Enzymes:  Lab 02/17/12 1004 02/17/12 0514 02/16/12 2232 02/16/12 1651  CKTOTAL -- -- -- --  CKMB -- -- -- --  CKMBINDEX -- -- -- --  TROPONINI <0.30 <0.30 <0.30 <0.30   BNP (last 3 results) No results found for this basename: PROBNP:3 in the last 8760 hours CBG:  Lab 02/18/12 1123 02/18/12 0740 02/17/12 2133 02/17/12 1638 02/17/12 1144  GLUCAP 105* 92  121* 70 92    Recent Results (from the past 240 hour(s))  CULTURE, BLOOD (ROUTINE X 2)     Status: Normal (Preliminary result)   Collection Time   02/16/12  8:00 PM      Component Value Range Status Comment   Specimen Description BLOOD LEFT HAND   Final    Special Requests BOTTLES DRAWN AEROBIC ONLY 1.5ML   Final    Culture  Setup Time 02/17/2012 02:29   Final    Culture     Final    Value:  BLOOD CULTURE RECEIVED NO GROWTH TO DATE CULTURE WILL BE HELD FOR 5 DAYS BEFORE ISSUING A FINAL NEGATIVE REPORT   Report Status PENDING   Incomplete   CULTURE, BLOOD (ROUTINE X 2)     Status: Normal (Preliminary result)   Collection Time   02/16/12  8:15 PM      Component Value Range Status Comment   Specimen Description BLOOD LEFT HAND   Final    Special Requests BOTTLES DRAWN AEROBIC ONLY   Final    Culture  Setup Time 02/17/2012 02:29   Final    Culture     Final    Value:        BLOOD CULTURE RECEIVED NO GROWTH TO DATE CULTURE WILL BE HELD FOR 5 DAYS BEFORE ISSUING A FINAL NEGATIVE REPORT   Report Status PENDING   Incomplete   MRSA PCR SCREENING     Status: Normal   Collection Time   02/16/12 11:30 PM      Component Value Range Status Comment   MRSA by PCR NEGATIVE  NEGATIVE Final      Studies: Ct Abdomen Pelvis W Contrast  02/17/2012  **ADDENDUM** CREATED: 02/17/2012 14:43:21  Addendum by Dr. Christiana Pellant 02/17/2012 at 2:43 p.m.  I was asked to review this examination with additional clinical information of left lower quadrant abdominal pain.  There is minimal pericolonic stranding at the level of the mid descending colon without adjacent fluid collection, free air, or bowel wall thickening.  There is a diverticulum in this area and this could represent very early diverticulitis, although scarring could have a similar appearance. The remainder of the examination was not re- reviewed.  Findings discussed with Dr. Malachi Bonds by Dr. Chilton Si at the time of this addendum.  **END ADDENDUM**  SIGNED BY: Harrel Lemon, M.D.   02/16/2012  *RADIOLOGY REPORT*  Clinical Data: Chest pain, kidney infection, diffuse abdominal pain, past history of COPD, coronary disease post MI, dementia, appendectomy, cholecystectomy, GERD  CT ABDOMEN AND PELVIS WITH CONTRAST  Technique:  Multidetector CT imaging of the abdomen and pelvis was performed following the standard protocol during bolus administration of intravenous contrast. Sagittal and coronal MPR images reconstructed from axial data set.  Contrast: 75mL OMNIPAQUE IOHEXOL 300 MG/ML  SOLN Dilute oral contrast.  Comparison: None  Findings: Pacemaker lead right ventricle. Minimal pericardial effusion. Tiny bibasilar pleural effusions with minimal adjacent lower lobe atelectasis. Prior cholecystectomy. Bilateral renal cortical atrophy with small cyst at inferior pole left kidney. No renal mass or hydronephrosis.  Liver, spleen, pancreas, and adrenal glands grossly normal appearance. Scattered respiratory motion artifacts. Duodenal diverticulum adjacent to second portion of duodenum. 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 Unremarkable bladder and ureters.  Extensive diverticulosis of the colon greatest at sigmoid colon without CT evidence of acute diverticulitis. Stomach and bowel loops otherwise normal appearance for degree of motion. No definite mass, adenopathy, free fluid, or inflammatory process. No hernia or free air. Bones demineralized.  IMPRESSION: Extensive colonic diverticulosis. Minimal pericardial and bilateral pleural effusion. Bilateral renal cortical atrophy. 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 No definite acute intra abdominal or pelvic abnormalities.   Original Report Authenticated By: Ulyses Southward, M.D.    Dg Chest Port 1 View  02/16/2012  *RADIOLOGY REPORT*  Clinical Data: Chest pain, cough,  congestion, hypertension, diabetes, COPD, coronary disease, former smoker  PORTABLE CHEST - 1 VIEW  Comparison: Portable exam 1649 hours compared to 07/12/2011  Findings: Patient's chin obscures the medial lung apices. Right subclavian sequential transveous pacemaker leads project at right atrium and right ventricle. Minimal enlargement of cardiac silhouette. Tortuous aorta. Pulmonary vascularity normal for technique. Calcified granulomata at left apex. Bronchitic changes with question infiltrate versus atelectasis at right lung base. No pleural effusion or pneumothorax. Bones demineralized.  IMPRESSION: Question mild atelectasis versus infiltrate at right base. Bronchitic and old granulomas disease changes.   Original Report Authenticated By: Ulyses Southward, M.D.     Scheduled Meds:    . acidophilus  2 capsule Oral TID  . insulin aspart  0-9 Units Subcutaneous TID WC  . isosorbide mononitrate  30 mg Oral Daily  . levothyroxine  200 mcg Oral QAC breakfast  . metoprolol tartrate  25 mg Oral BID  . pantoprazole  40 mg Oral Daily  . piperacillin-tazobactam (ZOSYN)  IV  3.375 g Intravenous Q8H  . polyethylene glycol  17 g Oral Daily  . sodium chloride  3 mL Intravenous Q12H  . Tamsulosin HCl  0.4 mg Oral QHS  . warfarin  4 mg Oral Q48H  . warfarin  6 mg Oral Q48H  . Warfarin - Physician Dosing Inpatient   Does not apply q1800   Continuous Infusions:    .  sodium bicarbonate infusion 1000 mL 100 mL/hr at 02/18/12 0941    Principal Problem:  *Bacteremia due to Gram-negative bacteria Active Problems:  Pacemaker  Senile dementia  Coronary artery disease  h/o TIA (transient ischemic attack)  Hypertension  Abdominal pain  Chest pain  Renal insufficiency, mild  Chronic cerebral sinus venous thrombosis    Time spent: 30    Ugochukwu Chichester, [facility]  [facility] Pager [phone]. If 8PM-8AM, please contact night-coverage at [email or web address], password [facility] 02/18/2012, 2:13 PM  LOS: 2 days

## 2012-02-19 DIAGNOSIS — K5732 Diverticulitis of large intestine without perforation or abscess without bleeding: Principal | ICD-10-CM

## 2012-02-19 DIAGNOSIS — I5020 Unspecified systolic (congestive) heart failure: Secondary | ICD-10-CM

## 2012-02-19 LAB — CBC
HCT: 34.5 % — ABNORMAL LOW (ref 39.0–52.0)
MCHC: 33.3 g/dL (ref 30.0–36.0)
Platelets: 118 10*3/uL — ABNORMAL LOW (ref 150–400)
RDW: 14.1 % (ref 11.5–15.5)

## 2012-02-19 LAB — GLUCOSE, CAPILLARY
Glucose-Capillary: 109 mg/dL — ABNORMAL HIGH (ref 70–99)
Glucose-Capillary: 107 mg/dL — ABNORMAL HIGH (ref 70–99)
Glucose-Capillary: 113 mg/dL — ABNORMAL HIGH (ref 70–99)

## 2012-02-19 LAB — COMPREHENSIVE METABOLIC PANEL
AST: 16 U/L (ref 0–37)
Albumin: 2.5 g/dL — ABNORMAL LOW (ref 3.5–5.2)
Alkaline Phosphatase: 86 U/L (ref 39–117)
BUN: 22 mg/dL (ref 6–23)
Potassium: 3.5 mEq/L (ref 3.5–5.1)
Total Protein: 6.2 g/dL (ref 6.0–8.3)

## 2012-02-19 LAB — PROTIME-INR
INR: 4.33 — ABNORMAL HIGH (ref 0.00–1.49)
Prothrombin Time: 38.8 seconds — ABNORMAL HIGH (ref 11.6–15.2)

## 2012-02-19 MED ORDER — WARFARIN - PHARMACIST DOSING INPATIENT
Freq: Every day | Status: DC
  Administered 2012-02-19: 16:00:00

## 2012-02-19 NOTE — Progress Notes (Signed)
[facility] PROGRESS NOTE  [name] WUJ:[id number] DOB: 1921/05/24 DOA: 02/16/2012 Walter: [name]  78 yo male h/o dementia, tia, pacer, cad went to [facility] ED for cough, fever and was diagnosed with bronchitis, cxr was neg and was given augmentin.  Patient was called back because his blood culture grew GNR and was admitted for sepsis and abdominal pain.  Source is likely diverticulitis and blood culture grew Raoultella planticola (previously Klebsiella sp.) sensitive to cipro.  Transitioned from zosyn to cipro/flagyl to complete a 14 day course.  Patient was previously not tolerating much of a diet due to abdominal pain, but slowly improving.   Assessment/Plan:  Raoultella planticola (previously Klebsiella sp.) bacteremia:  Most likely from diverticulitis. The patient has had severe episodes of bacteremia with various organisms, including pseudomonas about one year ago.  According to daughter, the pseudomonas bacteremia was attributed to a PICC line and although there was discussion about removing the patient's pacemaker as a possible nidus of infection, the cardiologist felt that the patient would not survive the procedure.  Raoultella is not a tenacious bacteria and is less likely to adhere to hardware.  Per Dr. Orvan Falconer, treat for diverticulitis for 14 days, no need to address pacemaker at this time.   -  Sensitivities placed on bedside chart -  Continue cipro/flagyl, day 4/14 of antibiotics  Abdominal pain, likely diverticulitis:  TTP in the left lower and right upper quadrant.  Reviewed CT scan from 12/21 with Dr. Chilton Si from radiology who sees some early stranding in the same area suggestive of diverticulitis.  Mesenteric vessels appear patent.   -  Cipro/flagyl day 4 -  Consider reimaging in a few days if not improving to rule out abscess -  Advance to full liquids today  Anemia, mild normocytic anemia, chronic and hgb at baseline of 11-12 for  patient. Thrombocytopenia, improving.  Likely due to acute infection.  DIC labs negative and no schistocytes on smear  Elevated D-dimer (part of DIC panel):  Likely acute phase reactant in the setting of sepsis/diverticulitis.  Patient is on therapeutic coumadin and therefore is lower risk for PE.  -  If signs and symptoms of PE, consider V/Q -  Continue therapeutic coumadin  Nongap metabolic acidosis.  No diarrhea and kidney function at baseline.  Concern for early gap metabolic acidosis, but lactate neg and patient is eating so less likely to be ketosis.  Improving with sodium bicarbonate fluids.   -  D/C IVF this evening. CKD, stage 3, creatinine at baseline.   -  Avoid nephrotoxins -  At risk for CIN after recent contrasted study  Wheezing:  May be due to heart failure versus COPD or less likely drug reaction. *COPD:  Avoid steroids if possible in setting of acute infection.  Budesonide with duonebs prn *Systolic CHF on ECHO today:  Pro BNP elevated.  Pt on BB, but not aspirin or plavix.  Not on ACEI.   -  Need previous cardiology records for comparison (old or new) and for explanation why he is not on traditional HF medications.    CAD with chest pain:  troponins neg and ECG stable.  Patient not on aspirin or plavix.  + BB, no statin HTN/HLD:  BP stable.  Cont. metoprolol DM:  fingersticks stable on dextrose gtt.   GERD:  Stable.    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  INR is supratherapeutic.  Dosing per pharmacy -  Outpatient follow up with Neurology Hypothyroidism:  Stable.  TSH 0.5. Continue synthroid  DIET:  Clear liquid ACCESS:  PIV IVF:  Sodium bicarb at 46ml/h, off this evening.   PROPH:  Therapeutic warfarin  Code Status: Full code Family Communication: spoke with the patient and his daughter who was  at bedside Disposition Plan:  To SNF pending patient tolerating diet.     Consultants:  None  Procedures:  CT ab/p  Antibiotics:  Vanco 12/21, then OFF  Levoflox 12/21, then OFF  Zosyn 12/22 >> 12/23  Cipro 12/23 >>  Flagyl 12/23 >>  HPI/Subjective:  Patient unable to give much history due to dementia, but more alert and coherent today.  He has had some mild shortness of breath, abdominal pain.  States he feels better.  Denies chest pain, nausea.   Objective: Filed Vitals:   02/18/12 2101 02/18/12 2137 02/18/12 2145 02/19/12 0640  BP:  144/82 144/82 118/77  Pulse:  85 84 84  Temp:   97.6 F (36.4 C) 97.8 F (36.6 C)  TempSrc:   Oral Oral  Resp: 16  18   Height:      Weight:    81.4 kg (179 lb 7.3 oz)  SpO2:   95% 97%    Intake/Output Summary (Last 24 hours) at 02/19/12 1610 Last data filed at 02/18/12 1202  Gross per 24 hour  Intake    150 ml  Output      0 ml  Net    150 ml   Filed Weights   02/16/12 2200 02/18/12 0500 02/19/12 0640  Weight: 81.6 kg (179 lb 14.3 oz) 81.4 kg (179 lb 7.3 oz) 81.4 kg (179 lb 7.3 oz)    Exam:   General:  Average weight CM, no acute distress and more alert on exam  HEENT:  MMM  Cardiovascular: Normal S1, S2, no murmurs, rubs, or gallops, 2+ pulses  Respiratory: CTAB today with no increased WOB  Abdomen: NABS, soft, mildly tender to palpation in the LLQ  And RUQ, much improved today.  No rebound or guarding.    MSK:  No LEE  Data Reviewed: Basic Metabolic Panel:  Lab 02/19/12 9604 02/18/12 0530 02/17/12 1558 02/17/12 0514 02/16/12 1648  NA 136 137 137 135 139  K 3.5 4.0 4.1 3.8 4.1  CL 104 108 108 106 105  CO2 21 18* 19 18* 20  GLUCOSE 130* 104* 79 94 125*  BUN 22 28* 29* 28* 32*  CREATININE 1.55* 1.57* 1.51* 1.41* 1.58*  CALCIUM 8.5 8.6 8.5 8.4 8.9  MG -- -- -- -- --  PHOS -- -- -- -- --   Liver Function Tests:  Lab 02/19/12 0515 02/18/12 0530 02/16/12 1648  AST 16 20 30   ALT 12 12 17   ALKPHOS 86  90 111  BILITOT 0.2* 0.3 0.5  PROT 6.2 6.2 7.0  ALBUMIN 2.5* 2.5* 3.2*    Lab 02/16/12 1648  LIPASE 9*  AMYLASE --   No results found for this basename: AMMONIA:5 in the last 168 hours CBC:  Lab 02/19/12 0515 02/18/12 0530 02/17/12 1558 02/17/12 0514 02/16/12 1648  WBC 8.2 8.4 7.8 9.0 12.2*  NEUTROABS -- -- 5.9 -- 9.7*  HGB 11.5* 12.5* 11.3* 11.6* 13.2  HCT 34.5* 36.6* 33.7* 35.3* 40.5  MCV 86.0 85.3 86.9 86.7 87.3  PLT 118* 88* 95*99* 89* 110*   Cardiac Enzymes:  Lab 02/17/12 1004 02/17/12 0514 02/16/12 2232 02/16/12 1651  CKTOTAL -- -- -- --  CKMB -- -- -- --  CKMBINDEX -- -- -- --  TROPONINI <0.30 <0.30 <0.30 <0.30   BNP (last 3 results)  [facility] 02/18/12 1618  PROBNP 7486.0*   CBG:  Lab 02/18/12 2144 02/18/12 1550 02/18/12 1123 02/18/12 0740 02/17/12 2133  GLUCAP 125* 135* 105* 92 121*    Recent Results (from the past 240 hour(s))  CULTURE, BLOOD (ROUTINE X 2)     Status: Normal (Preliminary result)   Collection Time   02/16/12  8:00 PM      Component Value Range Status Comment   Specimen Description BLOOD LEFT HAND   Final    Special Requests BOTTLES DRAWN AEROBIC ONLY 1.5ML   Final    Culture  Setup Time 02/17/2012 02:29   Final    Culture     Final    Value:        BLOOD CULTURE RECEIVED NO GROWTH TO DATE CULTURE WILL BE HELD FOR 5 DAYS BEFORE ISSUING A FINAL NEGATIVE REPORT   Report Status PENDING   Incomplete   CULTURE, BLOOD (ROUTINE X 2)     Status: Normal (Preliminary result)   Collection Time   02/16/12  8:15 PM      Component Value Range Status Comment   Specimen Description BLOOD LEFT HAND   Final    Special Requests BOTTLES DRAWN AEROBIC ONLY   Final    Culture  Setup Time 02/17/2012 02:29   Final    Culture     Final    Value:        BLOOD CULTURE RECEIVED NO GROWTH TO DATE CULTURE WILL BE HELD FOR 5 DAYS BEFORE ISSUING A FINAL NEGATIVE REPORT   Report Status PENDING   Incomplete   MRSA PCR SCREENING     Status: Normal   Collection  Time   02/16/12 11:30 PM      Component Value Range Status Comment   MRSA by PCR NEGATIVE  NEGATIVE Final      Studies: No results found.  Scheduled Meds:    . acidophilus  2 capsule Oral TID  . ipratropium  0.5 mg Nebulization TID   And  . albuterol  2.5 mg Nebulization TID  . budesonide  0.5 mg Nebulization BID  . ciprofloxacin  400 mg Intravenous Q24H  . insulin aspart  0-9 Units Subcutaneous TID WC  . isosorbide mononitrate  30 mg Oral Daily  . levothyroxine  200 mcg Oral QAC breakfast  . metoprolol tartrate  25 mg Oral BID  . metronidazole  500 mg Intravenous Q8H  . pantoprazole  40 mg Oral Daily  . polyethylene glycol  17 g Oral Daily  . sodium chloride  3 mL Intravenous Q12H  . Tamsulosin HCl  0.4 mg Oral QHS  . warfarin  4 mg Oral Q48H  . Warfarin - Physician Dosing Inpatient   Does not apply q1800   Continuous Infusions:    .  sodium bicarbonate infusion 1000 mL 100 mL/hr at 02/18/12 0941    Principal Problem:  *Bacteremia due to Gram-negative bacteria Active Problems:  Pacemaker  Senile dementia  Coronary artery disease  Hypertension  Abdominal pain  Chest pain  Renal insufficiency, mild  Chronic cerebral sinus venous thrombosis  Diverticulitis    Time spent: 30    Amanat Hackel, [facility]  [facility] Pager [phone]. If 8PM-8AM, please contact night-coverage at [email or web address], password [email or web address] 02/19/2012, 7:02 AM  LOS: 3 days

## 2012-02-19 NOTE — Progress Notes (Signed)
  Echocardiogram 2D Echocardiogram has been performed.  Shatha Hooser FRANCES 02/19/2012, 11:24 AM

## 2012-02-19 NOTE — Progress Notes (Signed)
ANTICOAGULATION CONSULT NOTE - Initial Consult  Pharmacy Consult for Coumadin Indication: PTA for multiple thrombosis of sinuses  Allergies  Allergen Reactions  . Morphine And Related Other (See Comments)    unknown   Labs:  Basename 02/19/12 0515 02/18/12 0530 02/17/12 1558 02/17/12 1004 02/17/12 0514 02/16/12 2232 02/16/12 1648  HGB 11.5* 12.5* -- -- -- -- --  HCT 34.5* 36.6* 33.7* -- -- -- --  PLT 118* 88* 95*99* -- -- -- --  APTT -- -- 68* -- -- -- 54*  LABPROT 38.8* -- 28.5* -- 26.0* -- --  INR 4.33* -- 2.86* -- 2.52* -- --  HEPARINUNFRC -- -- -- -- -- -- --  CREATININE 1.55* 1.57* 1.51* -- -- -- --  CKTOTAL -- -- -- -- -- -- --  CKMB -- -- -- -- -- -- --  TROPONINI -- -- -- <0.30 <0.30 <0.30 --    Estimated Creatinine Clearance: 35.1 ml/min (by C-G formula based on Cr of 1.55).   Medical History: Past Medical History  Diagnosis Date  . Hypertension   . Diabetes mellitus   . TIA (transient ischemic attack)   . Pacemaker   . COPD (chronic obstructive pulmonary disease)   . High cholesterol   . Pacemaker   . Senile dementia   . Intracranial bleed   . MI (myocardial infarction)   . GERD (gastroesophageal reflux disease)   . Coronary artery disease     Medications:  Prescriptions prior to admission  Medication Sig Dispense Refill  . albuterol-ipratropium (COMBIVENT) 18-103 MCG/ACT inhaler Inhale 2 puffs into the lungs every 4 (four) hours as needed. For wheezing.      Marland Kitchen amoxicillin-clavulanate (AUGMENTIN) 875-125 MG per tablet Take 1 tablet by mouth 2 (two) times daily.      . ciprofloxacin (CIPRO) 500 MG tablet Take 500 mg by mouth 2 (two) times daily.      Marland Kitchen docusate sodium (COLACE) 100 MG capsule Take 100 mg by mouth 2 (two) times daily as needed. For constipation      . Fe Fum-FA-B Cmp-C-Zn-Mg-Mn-Cu (HEMOCYTE PLUS) 106-1 MG CAPS Take 1 tablet by mouth daily.      Marland Kitchen glipiZIDE (GLUCOTROL) 5 MG tablet Take 5 mg by mouth 2 (two) times daily before a meal.       . isosorbide mononitrate (IMDUR) 30 MG 24 hr tablet Take 30 mg by mouth daily.      Marland Kitchen lactobacillus acidophilus (BACID) TABS Take 2 tablets by mouth 3 (three) times daily.      Marland Kitchen levothyroxine (SYNTHROID, LEVOTHROID) 200 MCG tablet Take 200 mcg by mouth daily.      Marland Kitchen lovastatin (MEVACOR) 10 MG tablet Take 10 mg by mouth at bedtime.      . metoprolol tartrate (LOPRESSOR) 25 MG tablet Take 25 mg by mouth 2 (two) times daily.      . mupirocin cream (BACTROBAN) 2 % Apply 1 application topically 3 (three) times daily.      . nitroGLYCERIN (NITROSTAT) 0.4 MG SL tablet Place 0.4 mg under the tongue every 5 (five) minutes as needed. For chest pain.      Marland Kitchen omeprazole (PRILOSEC) 40 MG capsule Take 40 mg by mouth daily.      . Tamsulosin HCl (FLOMAX) 0.4 MG CAPS Take 0.4 mg by mouth at bedtime.      Marland Kitchen warfarin (COUMADIN) 4 MG tablet Take 4-6 mg by mouth daily. Take 4 mg one day, 6 mg one day, and alternate  Assessment: 78 year old male on Coumadin PTA for chronic thrombosis of sinuses.    Dose PTA = 6 mg alternating with 4 mg every other day  INR now up to 4.33 most likely due to medications interactions (Cipro / Flagyl)  Goal of Therapy:  INR 2-3 Monitor platelets by anticoagulation protocol: Yes   Plan:  1) Hold Coumadin  2) Daily INR  Thank you. Okey Regal, PharmD. [phone]  02/19/2012,9:37 AM

## 2012-02-20 DIAGNOSIS — I5020 Unspecified systolic (congestive) heart failure: Secondary | ICD-10-CM

## 2012-02-20 DIAGNOSIS — D72829 Elevated white blood cell count, unspecified: Secondary | ICD-10-CM

## 2012-02-20 LAB — COMPREHENSIVE METABOLIC PANEL
ALT: 10 U/L (ref 0–53)
AST: 14 U/L (ref 0–37)
Albumin: 2.4 g/dL — ABNORMAL LOW (ref 3.5–5.2)
Alkaline Phosphatase: 79 U/L (ref 39–117)
BUN: 17 mg/dL (ref 6–23)
Chloride: 107 mEq/L (ref 96–112)
Potassium: 3.4 mEq/L — ABNORMAL LOW (ref 3.5–5.1)
Sodium: 140 mEq/L (ref 135–145)
Total Bilirubin: 0.2 mg/dL — ABNORMAL LOW (ref 0.3–1.2)
Total Protein: 6 g/dL (ref 6.0–8.3)

## 2012-02-20 LAB — GLUCOSE, CAPILLARY
Glucose-Capillary: 93 mg/dL (ref 70–99)
Glucose-Capillary: 153 mg/dL — ABNORMAL HIGH (ref 70–99)
Glucose-Capillary: 121 mg/dL — ABNORMAL HIGH (ref 70–99)
Glucose-Capillary: 127 mg/dL — ABNORMAL HIGH (ref 70–99)

## 2012-02-20 LAB — CBC
HCT: 33.5 % — ABNORMAL LOW (ref 39.0–52.0)
MCHC: 33.4 g/dL (ref 30.0–36.0)
Platelets: 124 10*3/uL — ABNORMAL LOW (ref 150–400)
RDW: 14 % (ref 11.5–15.5)
WBC: 6.4 10*3/uL (ref 4.0–10.5)

## 2012-02-20 LAB — PROTIME-INR: Prothrombin Time: 49 seconds — ABNORMAL HIGH (ref 11.6–15.2)

## 2012-02-20 NOTE — Progress Notes (Addendum)
CRITICAL VALUE ALERT  Critical value received:  INR 5.94  Date of notification:  02/20/12   Time of notification:  0550  Critical value read back:yes  Nurse who received alert:  Nikki Dom, RN  MD notified (1st page):  Tama Gander, NP  Time of first page:  [phone]  MD notified (2nd page):  Time of second page:  Responding MD:  Tama Gander, NP  Time MD responded:  [phone]

## 2012-02-20 NOTE — Progress Notes (Signed)
[facility] PROGRESS NOTE  [name] WUJ:[id number] DOB: 1922/02/03 DOA: 02/16/2012 PCP: Pcp Not In System  78 yo male h/o dementia, tia, pacer, cad went to [facility] ED for cough, fever and was diagnosed with bronchitis, cxr was neg and was given augmentin.  Patient was called back because his blood culture grew GNR and was admitted for sepsis and abdominal pain.  Source is likely diverticulitis and blood culture grew Raoultella planticola (previously Klebsiella sp.) sensitive to cipro.  Transitioned from zosyn to cipro/flagyl to complete a 14 day course.  Patient was previously not tolerating much of a diet due to abdominal pain, but slowly improving.   Assessment/Plan:  Raoultella planticola (previously Klebsiella sp.) bacteremia:  Most likely from diverticulitis. The patient has had severe episodes of bacteremia with various organisms, including pseudomonas about one year ago.  According to daughter, the pseudomonas bacteremia was attributed to a PICC line and although there was discussion about removing the patient's pacemaker as a possible nidus of infection, the cardiologist felt that the patient would not survive the procedure.  Raoultella is not a tenacious bacteria and is less likely to adhere to hardware.  Per Dr. Orvan Falconer, treat for diverticulitis for 14 days, no need to address pacemaker at this time.   -  Sensitivities placed on bedside chart -  Continue cipro/flagyl, day 5/14 of antibiotics  Abdominal pain, likely diverticulitis:  TTP in the left lower and right upper quadrant.  Reviewed CT scan from 12/21 with Dr. Chilton Si from radiology who sees some early stranding in the same area suggestive of diverticulitis.  Mesenteric vessels appear patent.   -  Cipro/flagyl day 5 -  Consider reimaging in a few days if not improving to rule out abscess -  Advance to full liquids today  Anemia, mild normocytic anemia, chronic and hgb at baseline of 11-12 for  patient. Thrombocytopenia, improving.  Likely due to acute infection.  DIC labs negative and no schistocytes on smear  Elevated D-dimer (part of DIC panel):  Likely acute phase reactant in the setting of sepsis/diverticulitis.  Patient is on therapeutic coumadin and therefore is lower risk for PE.  -  If signs and symptoms of PE, consider V/Q (no increased work of breathing, SOB, cough reported 02/20/12) -  Continue therapeutic coumadin  Nongap metabolic acidosis.  No diarrhea and kidney function at baseline.  Concern for early gap metabolic acidosis, but lactate neg and patient is eating so less likely to be ketosis.  Improving with sodium bicarbonate fluids.   -  IVF d/c'd   CKD, stage 3, creatinine at baseline.   -  Avoid nephrotoxins -  At risk for CIN after recent contrasted study  Wheezing:  May be due to heart failure versus COPD or less likely drug reaction. *COPD:  Avoid steroids if possible in setting of acute infection.  Budesonide with duonebs prn *Systolic CHF on ECHO today:  Pro BNP elevated.  Pt on BB, but not aspirin or plavix.  Not on ACEI.   -  Need previous cardiology records for comparison (old or new) and for explanation why he is not on traditional HF medications.    CAD with chest pain:  troponins neg and ECG stable.  Patient not on aspirin or plavix.  + BB, no statin HTN/HLD:  BP stable.  Cont. metoprolol DM:  fingersticks stable on dextrose gtt.   GERD:  Stable.    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  INR is supratherapeutic.  Dosing per pharmacy -  Outpatient follow up with Neurology  Hypothyroidism:  Stable.  TSH 0.5. Continue synthroid  DIET:  Clear liquid ACCESS:  PIV IVF:  Sodium bicarb at 63ml/h, off this evening.   PROPH:  Therapeutic warfarin  Code Status: Full code Family  Communication: spoke with the patient and his daughter who was at bedside Disposition Plan:  To SNF pending patient tolerating diet.     Consultants:  None  Procedures:  CT ab/p  Antibiotics:  Vanco 12/21, then OFF  Levoflox 12/21, then OFF  Zosyn 12/22 >> 12/23  Cipro 12/23 >>  Flagyl 12/23 >>  HPI/Subjective:  No new complaints reported. Daughter had questions regarding patient's progression which were answered to her satisfaction.  Patient's INR was elevated again and reported overnight.    Objective: Filed Vitals:   02/20/12 0700 02/20/12 0925 02/20/12 1100 02/20/12 1500  BP: 126/72  125/66 108/57  Pulse: 87  87 82  Temp: 97.8 F (36.6 C)   97.4 F (36.3 C)  TempSrc: Oral   Oral  Resp:    16  Height:      Weight: 81.194 kg (179 lb)     SpO2: 96% 94%  95%    Intake/Output Summary (Last 24 hours) at 02/20/12 1510 Last data filed at 02/20/12 1300  Gross per 24 hour  Intake    440 ml  Output    500 ml  Net    -60 ml   Filed Weights   02/18/12 0500 02/19/12 0640 02/20/12 0700  Weight: 81.4 kg (179 lb 7.3 oz) 81.4 kg (179 lb 7.3 oz) 81.194 kg (179 lb)    Exam:   General:  Average weight CM, no acute distress. Alert and awake  HEENT:  MMM  Cardiovascular: Normal S1, S2, no murmurs, rubs, or gallops, 2+ pulses  Respiratory: CTAB today with no increased WOB, no wheezes or cough  Abdomen: NABS, soft, mildly tender to palpation in the LLQ  And LUQ.  No rebound or guarding.    Data Reviewed: Basic Metabolic Panel:  Lab 02/20/12 4098 02/19/12 0515 02/18/12 0530 02/17/12 1558 02/17/12 0514  NA 140 136 137 137 135  K 3.4* 3.5 4.0 4.1 3.8  CL 107 104 108 108 106  CO2 22 21 18* 19 18*  GLUCOSE 88 130* 104* 79 94  BUN 17 22 28* 29* 28*  CREATININE 1.52* 1.55* 1.57* 1.51* 1.41*  CALCIUM 8.5 8.5 8.6 8.5 8.4  MG -- -- -- -- --  PHOS -- -- -- -- --   Liver Function Tests:  Lab 02/20/12 0447 02/19/12 0515 02/18/12 0530 02/16/12 1648  AST 14 16 20 30    ALT 10 12 12 17   ALKPHOS 79 86 90 111  BILITOT 0.2* 0.2* 0.3 0.5  PROT 6.0 6.2 6.2 7.0  ALBUMIN 2.4* 2.5* 2.5* 3.2*    Lab 02/16/12 1648  LIPASE 9*  AMYLASE --   No results found for this basename: AMMONIA:5 in the last 168 hours CBC:  Lab 02/20/12 0447 02/19/12 0515 02/18/12 0530 02/17/12 1558 02/17/12 0514 02/16/12 1648  WBC 6.4 8.2 8.4 7.8 9.0 --  NEUTROABS -- -- -- 5.9 -- 9.7*  HGB 11.2* 11.5* 12.5* 11.3* 11.6* --  HCT 33.5* 34.5* 36.6* 33.7* 35.3* --  MCV 85.2 86.0 85.3 86.9 86.7 --  PLT 124* 118* 88* 95*99* 89* --  Cardiac Enzymes:  Lab 02/17/12 1004 02/17/12 0514 02/16/12 2232 02/16/12 1651  CKTOTAL -- -- -- --  CKMB -- -- -- --  CKMBINDEX -- -- -- --  TROPONINI <0.30 <0.30 <0.30 <0.30   BNP (last 3 results)  [facility] 02/18/12 1618  PROBNP 7486.0*   CBG:  Lab 02/20/12 1123 02/20/12 0734 02/19/12 2140 02/19/12 1629 02/19/12 1111  GLUCAP 153* 93 142* 113* 107*    Recent Results (from the past 240 hour(s))  CULTURE, BLOOD (ROUTINE X 2)     Status: Normal (Preliminary result)   Collection Time   02/16/12  8:00 PM      Component Value Range Status Comment   Specimen Description BLOOD LEFT HAND   Final    Special Requests BOTTLES DRAWN AEROBIC ONLY 1.5ML   Final    Culture  Setup Time 02/17/2012 02:29   Final    Culture     Final    Value:        BLOOD CULTURE RECEIVED NO GROWTH TO DATE CULTURE WILL BE HELD FOR 5 DAYS BEFORE ISSUING A FINAL NEGATIVE REPORT   Report Status PENDING   Incomplete   CULTURE, BLOOD (ROUTINE X 2)     Status: Normal (Preliminary result)   Collection Time   02/16/12  8:15 PM      Component Value Range Status Comment   Specimen Description BLOOD LEFT HAND   Final    Special Requests BOTTLES DRAWN AEROBIC ONLY   Final    Culture  Setup Time 02/17/2012 02:29   Final    Culture     Final    Value:        BLOOD CULTURE RECEIVED NO GROWTH TO DATE CULTURE WILL BE HELD FOR 5 DAYS BEFORE ISSUING A FINAL NEGATIVE REPORT   Report  Status PENDING   Incomplete   MRSA PCR SCREENING     Status: Normal   Collection Time   02/16/12 11:30 PM      Component Value Range Status Comment   MRSA by PCR NEGATIVE  NEGATIVE Final      Studies: No results found.  Scheduled Meds:    . acidophilus  2 capsule Oral TID  . ipratropium  0.5 mg Nebulization TID   And  . albuterol  2.5 mg Nebulization TID  . budesonide  0.5 mg Nebulization BID  . ciprofloxacin  400 mg Intravenous Q24H  . insulin aspart  0-9 Units Subcutaneous TID WC  . isosorbide mononitrate  30 mg Oral Daily  . levothyroxine  200 mcg Oral QAC breakfast  . metoprolol tartrate  25 mg Oral BID  . metronidazole  500 mg Intravenous Q8H  . pantoprazole  40 mg Oral Daily  . polyethylene glycol  17 g Oral Daily  . sodium chloride  3 mL Intravenous Q12H  . Tamsulosin HCl  0.4 mg Oral QHS  . Warfarin - Pharmacist Dosing Inpatient   Does not apply q1800   Continuous Infusions:    Principal Problem:  *Bacteremia due to Gram-negative bacteria Active Problems:  Pacemaker  Senile dementia  Coronary artery disease  Hypertension  Abdominal pain  Chest pain  Renal insufficiency, mild  Chronic cerebral sinus venous thrombosis  Diverticulitis  Systolic CHF    Time spent: 30    Penny Pia  [facility] Pager [phone]. If 8PM-8AM, please contact night-coverage at [email or web address], password [facility] 02/20/2012, 3:10 PM  LOS: 4 days

## 2012-02-20 NOTE — Progress Notes (Signed)
ANTICOAGULATION CONSULT NOTE  Pharmacy Consult for Coumadin Indication: PTA for multiple thrombosis of sinuses  Allergies  Allergen Reactions  . Morphine And Related Other (See Comments)    unknown   Labs:  Basename 02/20/12 0447 02/19/12 0515 02/18/12 0530 02/17/12 1558 02/17/12 1004  HGB 11.2* 11.5* -- -- --  HCT 33.5* 34.5* 36.6* -- --  PLT 124* 118* 88* -- --  APTT -- -- -- 68* --  LABPROT 49.0* 38.8* -- 28.5* --  INR 5.94* 4.33* -- 2.86* --  HEPARINUNFRC -- -- -- -- --  CREATININE 1.52* 1.55* 1.57* -- --  CKTOTAL -- -- -- -- --  CKMB -- -- -- -- --  TROPONINI -- -- -- -- <0.30    Estimated Creatinine Clearance: 35.8 ml/min (by C-G formula based on Cr of 1.52).   Assessment: 78 year old male on Coumadin PTA for chronic thrombosis of sinuses.    Dose PTA = 6 mg alternating with 4 mg every other day  INR now up to 5.94 most likely due to medications interactions (Cipro / Flagyl)  Goal of Therapy:  INR 2-3 Monitor platelets by anticoagulation protocol: Yes   Plan:  1) Hold Coumadin  2) Daily INR  Thank you. Okey Regal, PharmD. [phone]  02/20/2012,8:06 AM

## 2012-02-21 LAB — GLUCOSE, CAPILLARY
Glucose-Capillary: 111 mg/dL — ABNORMAL HIGH (ref 70–99)
Glucose-Capillary: 140 mg/dL — ABNORMAL HIGH (ref 70–99)

## 2012-02-21 LAB — COMPREHENSIVE METABOLIC PANEL
ALT: 9 U/L (ref 0–53)
AST: 14 U/L (ref 0–37)
Albumin: 2.4 g/dL — ABNORMAL LOW (ref 3.5–5.2)
CO2: 24 mEq/L (ref 19–32)
Chloride: 110 mEq/L (ref 96–112)
Creatinine, Ser: 1.48 mg/dL — ABNORMAL HIGH (ref 0.50–1.35)
Sodium: 143 mEq/L (ref 135–145)
Total Bilirubin: 0.2 mg/dL — ABNORMAL LOW (ref 0.3–1.2)

## 2012-02-21 LAB — CBC
MCV: 84.3 fL (ref 78.0–100.0)
Platelets: 130 10*3/uL — ABNORMAL LOW (ref 150–400)
RBC: 3.89 MIL/uL — ABNORMAL LOW (ref 4.22–5.81)
RDW: 14.2 % (ref 11.5–15.5)
WBC: 6.4 10*3/uL (ref 4.0–10.5)

## 2012-02-21 LAB — PROTIME-INR: INR: 4.44 — ABNORMAL HIGH (ref 0.00–1.49)

## 2012-02-21 MED ORDER — IPRATROPIUM BROMIDE 0.02 % IN SOLN: 0.5000 mg | RESPIRATORY_TRACT | Status: DC | PRN

## 2012-02-21 NOTE — Progress Notes (Signed)
ANTICOAGULATION CONSULT NOTE  Pharmacy Consult for Coumadin Indication: PTA for multiple thrombosis of sinuses  Allergies  Allergen Reactions  . Morphine And Related Other (See Comments)    unknown   Labs:  Basename 02/21/12 0540 02/20/12 0447 02/19/12 0515  HGB 11.0* 11.2* --  HCT 32.8* 33.5* 34.5*  PLT 130* 124* 118*  APTT -- -- --  LABPROT 39.5* 49.0* 38.8*  INR 4.44* 5.94* 4.33*  HEPARINUNFRC -- -- --  CREATININE 1.48* 1.52* 1.55*  CKTOTAL -- -- --  CKMB -- -- --  TROPONINI -- -- --    Estimated Creatinine Clearance: 36.7 ml/min (by C-G formula based on Cr of 1.48).   Assessment: 78 year old male on Coumadin PTA for chronic thrombosis of sinuses.    Dose PTA = 6 mg alternating with 4 mg every other day  INR trending down to 4.44 most likely due to medications interactions (Cipro / Flagyl)  Goal of Therapy:  INR 2-3 Monitor platelets by anticoagulation protocol: Yes   Plan:  1) Hold Coumadin  2) Daily INR  Thank you. Okey Regal, PharmD. [phone]  02/21/2012,10:29 AM

## 2012-02-21 NOTE — Clinical Social Work Psychosocial (Signed)
     Clinical Social Work Department BRIEF PSYCHOSOCIAL ASSESSMENT 02/21/2012  Patient:  [name], [name]     Account Number:  [id number]     Admit date:  02/16/2012  Clinical Social Worker:  Margaree Mackintosh  Date/Time:  02/21/2012 04:16 PM  Referred by:  Physician  Date Referred:  02/21/2012 Referred for  SNF Placement  SNF Placement   Other Referral:   Interview type:  Family Other interview type:   Pt present.    PSYCHOSOCIAL DATA Living Status:  FAMILY Admitted from facility:   Level of care:   Primary support name:  [name]: [phone] Primary support relationship to patient:  CHILD, ADULT Degree of support available:   Adequate.    CURRENT CONCERNS Current Concerns  Post-Acute Placement   Other Concerns:    SOCIAL WORK ASSESSMENT / PLAN Clinical Social Worker recieved referral for SNF.  CSW reviewed chart and met wtih pt and dtr at bedside.  CSW introduced self, explained role, and provided support. CSW reviewed potential need for SNF at dc; dtr adamently refusing SNF or HH at this time.  Dtr reports she has been pt's caretaker "for years" and is "not impressed" with hh. Per PT notes, PT provided education on ROM excercises.  CSW reviewed support system and encouraged positive self care with dtr.  CSW updated RNCM and MD.  CSW to sign off at this time, please reconsult if needed.   Assessment/plan status:  Information/Referral to [facility] Other assessment/ plan:   Information/referral to community resources:   SNF  [facility]    PATIENTS/FAMILYS RESPONSE TO PLAN OF CARE: Though present, pt was minimally participatory in assessment.  Dtr was pleasant but not open to intervention.

## 2012-02-21 NOTE — Progress Notes (Signed)
[facility] PROGRESS NOTE  [name] ZOX:[id number] DOB: 1921-04-27 DOA: 02/16/2012 PCP: Pcp Not In System  78 yo male h/o dementia, tia, pacer, cad went to [facility] ED for cough, fever and was diagnosed with bronchitis, cxr was neg and was given augmentin.  Patient was called back because his blood culture grew GNR and was admitted for sepsis and abdominal pain.  Source is likely diverticulitis and blood culture grew Raoultella planticola (previously Klebsiella sp.) sensitive to cipro.  Transitioned from zosyn to cipro/flagyl to complete a 14 day course.  Patient was previously not tolerating much of a diet due to abdominal pain, but slowly improving.   Assessment/Plan:  Raoultella planticola (previously Klebsiella sp.) bacteremia:  Most likely from diverticulitis. The patient has had severe episodes of bacteremia with various organisms, including pseudomonas about one year ago.  According to daughter, the pseudomonas bacteremia was attributed to a PICC line and although there was discussion about removing the patient's pacemaker as a possible nidus of infection, the cardiologist felt that the patient would not survive the procedure.  Raoultella is not a tenacious bacteria and is less likely to adhere to hardware.  Per Dr. Orvan Falconer, treat for diverticulitis for 14 days, no need to address pacemaker at this time.   -  Sensitivities placed on bedside chart -  Continue cipro/flagyl, day 6/14 of antibiotics  Abdominal pain, likely diverticulitis:  TTP in the left lower and right upper quadrant.  Reviewed CT scan from 12/21 with Dr. Chilton Si from radiology who sees some early stranding in the same area suggestive of diverticulitis.  Mesenteric vessels appear patent.   -  Cipro/flagyl day 6 -  Consider reimaging in a few days if not improving to rule out abscess (patient continues with improvement and therefore will not plan on re imaging at this juncture) -  Currently on full liquids will  advance diet as tolerated to heart healthy diet/ diabetic diet.  Anemia, mild normocytic anemia, chronic and hgb at baseline of 11-12 for patient. Thrombocytopenia, improving.  Likely due to acute infection.  DIC labs negative and no schistocytes on smear  Elevated D-dimer (part of DIC panel):  Likely acute phase reactant in the setting of sepsis/diverticulitis.  Patient is on therapeutic coumadin and therefore is lower risk for PE.  -  If signs and symptoms of PE, consider V/Q (no increased work of breathing, SOB, cough reported 02/21/12) -  Continue therapeutic coumadin  Nongap metabolic acidosis.  No diarrhea and kidney function at baseline.  Concern for early gap metabolic acidosis, but lactate neg and patient is eating so less likely to be ketosis.  Improving with sodium bicarbonate fluids.   -  IVF d/c'd   CKD, stage 3, creatinine at baseline.   -  Avoid nephrotoxins. Creatinine continues to improve -  At risk for CIN after recent contrasted study but currently improving.  Wheezing:  Resolved  *COPD:  Avoid steroids if possible in setting of acute infection.  Budesonide with duonebs prn *Systolic CHF on ECHO today:  Pro BNP elevated.  Pt on BB, but not aspirin or plavix.  Not on ACEI.   -  Need previous cardiology records for comparison (old or new) and for explanation why he is not on traditional HF medications.    CAD with chest pain:  troponins neg and ECG stable.  Patient not on aspirin or plavix.  + BB, no statin HTN/HLD:  BP stable.  Cont. metoprolol DM:   - Continue to monitor CBG's -  Continue current regimen and adjust medications pending blood sugars.   GERD:  Stable.    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  INR is supratherapeutic.  Dosing per pharmacy -  Outpatient follow up with  Neurology  Hypothyroidism:  Stable.  TSH 0.5. Continue synthroid  DIET:  Clear liquid ACCESS:  PIV IVF:  Sodium bicarb at 49ml/h, off this evening.   PROPH:  Therapeutic warfarin  Code Status: Full code Family Communication: spoke with the patient and his daughter who was at bedside Disposition Plan:  To SNF pending patient tolerating diet.  Will try to advance diet today 02/21/12   Consultants:  None  Procedures:  CT ab/p  Antibiotics:  Vanco 12/21, then OFF  Levoflox 12/21, then OFF  Zosyn 12/22 >> 12/23  Cipro 12/23 >>  Flagyl 12/23 >>  HPI/Subjective:  No new complaints reported. Patient denies any abdominal pain.  No acute issues overnight.  INR still elevated.    Objective: Filed Vitals:   02/20/12 2100 02/21/12 0700 02/21/12 1103 02/21/12 1340  BP: 127/69 148/70 132/73 132/80  Pulse: 83 88 86 85  Temp: 97.7 F (36.5 C) 98 F (36.7 C)  97 F (36.1 C)  TempSrc:    Oral  Resp: 18 18  18   Height:      Weight:      SpO2: 95% 95%  97%    Intake/Output Summary (Last 24 hours) at 02/21/12 1509 Last data filed at 02/21/12 0801  Gross per 24 hour  Intake    300 ml  Output      0 ml  Net    300 ml   Filed Weights   02/18/12 0500 02/19/12 0640 02/20/12 0700  Weight: 81.4 kg (179 lb 7.3 oz) 81.4 kg (179 lb 7.3 oz) 81.194 kg (179 lb)    Exam:   General:  Average weight CM, no acute distress. Alert and awake  HEENT:  MMM  Cardiovascular: Normal S1, S2, no murmurs, rubs, or gallops, 2+ pulses  Respiratory: CTAB today with no increased WOB, no wheezes or cough  Abdomen: obese, soft, mildly tender to palpation in the LLQ  And LUQ.  No rebound or guarding.    Data Reviewed: Basic Metabolic Panel:  Lab 02/21/12 1610 02/20/12 0447 02/19/12 0515 02/18/12 0530 02/17/12 1558  NA 143 140 136 137 137  K 3.6 3.4* 3.5 4.0 4.1  CL 110 107 104 108 108  CO2 24 22 21  18* 19  GLUCOSE 99 88 130* 104* 79  BUN 18 17 22  28* 29*  CREATININE 1.48* 1.52* 1.55*  1.57* 1.51*  CALCIUM 8.5 8.5 8.5 8.6 8.5  MG -- -- -- -- --  PHOS -- -- -- -- --   Liver Function Tests:  Lab 02/21/12 0540 02/20/12 0447 02/19/12 0515 02/18/12 0530 02/16/12 1648  AST 14 14 16 20 30   ALT 9 10 12 12 17   ALKPHOS 78 79 86 90 111  BILITOT 0.2* 0.2* 0.2* 0.3 0.5  PROT 6.0 6.0 6.2 6.2 7.0  ALBUMIN 2.4* 2.4* 2.5* 2.5* 3.2*    Lab 02/16/12 1648  LIPASE 9*  AMYLASE --   No results found for this basename: AMMONIA:5 in the last 168 hours CBC:  Lab 02/21/12 0540 02/20/12 0447 02/19/12 0515 02/18/12 0530 02/17/12 1558 02/16/12 1648  WBC 6.4 6.4 8.2 8.4 7.8 --  NEUTROABS -- -- -- -- 5.9 9.7*  HGB 11.0* 11.2* 11.5* 12.5* 11.3* --  HCT 32.8* 33.5* 34.5* 36.6* 33.7* --  MCV 84.3 85.2 86.0 85.3 86.9 --  PLT 130* 124* 118* 88* 95*99* --   Cardiac Enzymes:  Lab 02/17/12 1004 02/17/12 0514 02/16/12 2232 02/16/12 1651  CKTOTAL -- -- -- --  CKMB -- -- -- --  CKMBINDEX -- -- -- --  TROPONINI <0.30 <0.30 <0.30 <0.30   BNP (last 3 results)  [facility] 02/18/12 1618  PROBNP 7486.0*   CBG:  Lab 02/21/12 1149 02/21/12 0752 02/20/12 2013 02/20/12 1640 02/20/12 1123  GLUCAP 128* 111* 127* 121* 153*    Recent Results (from the past 240 hour(s))  CULTURE, BLOOD (ROUTINE X 2)     Status: Normal (Preliminary result)   Collection Time   02/16/12  8:00 PM      Component Value Range Status Comment   Specimen Description BLOOD LEFT HAND   Final    Special Requests BOTTLES DRAWN AEROBIC ONLY 1.5ML   Final    Culture  Setup Time 02/17/2012 02:29   Final    Culture     Final    Value:        BLOOD CULTURE RECEIVED NO GROWTH TO DATE CULTURE WILL BE HELD FOR 5 DAYS BEFORE ISSUING A FINAL NEGATIVE REPORT   Report Status PENDING   Incomplete   CULTURE, BLOOD (ROUTINE X 2)     Status: Normal (Preliminary result)   Collection Time   02/16/12  8:15 PM      Component Value Range Status Comment   Specimen Description BLOOD LEFT HAND   Final    Special Requests BOTTLES DRAWN AEROBIC  ONLY   Final    Culture  Setup Time 02/17/2012 02:29   Final    Culture     Final    Value:        BLOOD CULTURE RECEIVED NO GROWTH TO DATE CULTURE WILL BE HELD FOR 5 DAYS BEFORE ISSUING A FINAL NEGATIVE REPORT   Report Status PENDING   Incomplete   MRSA PCR SCREENING     Status: Normal   Collection Time   02/16/12 11:30 PM      Component Value Range Status Comment   MRSA by PCR NEGATIVE  NEGATIVE Final      Studies: No results found.  Scheduled Meds:    . acidophilus  2 capsule Oral TID  . budesonide  0.5 mg Nebulization BID  . ciprofloxacin  400 mg Intravenous Q24H  . insulin aspart  0-9 Units Subcutaneous TID WC  . isosorbide mononitrate  30 mg Oral Daily  . levothyroxine  200 mcg Oral QAC breakfast  . metoprolol tartrate  25 mg Oral BID  . metronidazole  500 mg Intravenous Q8H  . pantoprazole  40 mg Oral Daily  . polyethylene glycol  17 g Oral Daily  . sodium chloride  3 mL Intravenous Q12H  . Tamsulosin HCl  0.4 mg Oral QHS  . Warfarin - Pharmacist Dosing Inpatient   Does not apply q1800   Continuous Infusions:    Principal Problem:  *Bacteremia due to Gram-negative bacteria Active Problems:  Pacemaker  Senile dementia  Coronary artery disease  Hypertension  Abdominal pain  Chest pain  Renal insufficiency, mild  Chronic cerebral sinus venous thrombosis  Diverticulitis  Systolic CHF    Time spent: 30    Penny Pia  [facility] Pager [phone]. If 8PM-8AM, please contact night-coverage at [email or web address],  password [email or web address] 02/21/2012, 3:09 PM  LOS: 5 days

## 2012-02-21 NOTE — Evaluation (Signed)
Physical Therapy Evaluation Patient Details Name: [name] MRN: [id number] DOB: 1921-10-29 Today's Date: 02/21/2012 Time: 7829-5621 PT Time Calculation (min): 22 min  PT Assessment / Plan / Recommendation Clinical Impression  Pt admitted with bacteremia. Pt has an extensive medical history and has been bed/WC bound for the past 15 years, independently getting to his WC. Spoke with pt and daughter regarding d/c and they want pt to go home as they have the means to take care of him. Encouraged daughter to continue exercises and ROM as they are refusing HHPT. Pt will benefit from skilled PT in the acute care setting in order to maximize functional mobility, strength and safety prior to d/c home    PT Assessment  Patient needs continued PT services    Follow Up Recommendations  No PT follow up;Supervision/Assistance - 24 hour    Does the patient have the potential to tolerate intense rehabilitation      Barriers to Discharge        Equipment Recommendations  None recommended by PT    Recommendations for Other Services     Frequency Min 3X/week    Precautions / Restrictions Precautions Precautions: Fall Restrictions Weight Bearing Restrictions: No   Pertinent Vitals/Pain No complaints of pain. Fatigue with movement.       Mobility  Bed Mobility Bed Mobility: Supine to Sit;Sitting - Scoot to [place] of Bed;Sit to Supine Supine to Sit: 4: Min assist Sitting - Scoot to Edge of Bed: 5: Supervision Sit to Supine: 4: Min assist Details for Bed Mobility Assistance: Min assist with trunk support and control in/out of bed Transfers Transfers: Sit to Stand;Stand to Sit Sit to Stand: 3: Mod assist;With upper extremity assist;From bed Stand to Sit: 3: Mod assist;With upper extremity assist;To bed Details for Transfer Assistance: Mod assist for stability and control. Cues for hand placement and anterior translation. Increased time to achieve full standing  position Ambulation/Gait Ambulation/Gait Assistance: Not tested (comment)    Shoulder Instructions     Exercises     PT Diagnosis: Generalized weakness  PT Problem List: Decreased strength;Decreased activity tolerance;Decreased mobility;Decreased knowledge of use of DME;Decreased safety awareness;Decreased knowledge of precautions PT Treatment Interventions: Functional mobility training;Therapeutic activities;Therapeutic exercise;Patient/family education   PT Goals Acute Rehab PT Goals PT Goal Formulation: With patient/family Time For Goal Achievement: 03/06/12 Potential to Achieve Goals: Good Pt will go Sit to Supine/Side: with modified independence PT Goal: Sit to Supine/Side - Progress: Goal set today Pt will go Sit to Stand: with modified independence PT Goal: Sit to Stand - Progress: Goal set today Pt will go Stand to Sit: with modified independence PT Goal: Stand to Sit - Progress: Goal set today Pt will Transfer Bed to Chair/Chair to Bed: with supervision PT Transfer Goal: Bed to Chair/Chair to Bed - Progress: Goal set today  Visit Information  Last PT Received On: 02/21/12 Assistance Needed: +2 (for ambulation)    Subjective Data  Subjective: "I just want to be left alone." Patient Stated Goal: familys goal: to get patient home   Prior Functioning  Home Living Lives With: Daughter Available Help at Discharge: Family;Available 24 hours/day Type of Home: House Home Access: Ramped entrance Home Layout: One level Bathroom Shower/Tub: Other (comment) (pt gets washed in bed) Bathroom Toilet:  (uses urinal and wears depends) Home Adaptive Equipment: Hospital bed;Wheelchair - manual;Walker - rolling Additional Comments: pt is bed to wheelchair only. Prior Function Level of Independence: Needs assistance Needs Assistance: Bathing;Dressing;Toileting;Meal Prep;Light Housekeeping;Gait;Transfers Bath: Total Dressing: Moderate  Toileting: Total Meal Prep: Total Light  Housekeeping: Total Gait Assistance: pt has been non-ambulatory for ~15 years Transfer Assistance: pt can transfer indpendently from bed to WC Able to Take Stairs?: No Driving: No Vocation: Retired Comments: daughter does all bathing/dressing Communication Communication: HOH Dominant Hand: Right    Cognition  Overall Cognitive Status: History of cognitive impairments - at baseline Arousal/Alertness: Lethargic Orientation Level: Disoriented to;Place;Time;Situation Behavior During Session: Lethargic    Extremity/Trunk Assessment Right Lower Extremity Assessment RLE ROM/Strength/Tone: Within functional levels RLE Sensation: WFL - Light Touch Left Lower Extremity Assessment LLE ROM/Strength/Tone: Within functional levels LLE Sensation: WFL - Light Touch   Balance Balance Balance Assessed: Yes Static Sitting Balance Static Sitting - Balance Support: Bilateral upper extremity supported;Feet supported Static Sitting - Level of Assistance: 7: Independent  End of Session PT - End of Session Equipment Utilized During Treatment: Gait belt Activity Tolerance: Patient limited by fatigue Patient left: in bed;with call bell/phone within reach;with family/visitor present Nurse Communication: Mobility status  GP     Milana Kidney 02/21/2012, 10:23 AM  02/21/2012 Milana Kidney DPT PAGER: [phone] OFFICE: [phone]

## 2012-02-22 LAB — COMPREHENSIVE METABOLIC PANEL
ALT: 12 U/L (ref 0–53)
Alkaline Phosphatase: 89 U/L (ref 39–117)
BUN: 20 mg/dL (ref 6–23)
CO2: 23 mEq/L (ref 19–32)
Chloride: 105 mEq/L (ref 96–112)
GFR calc Af Amer: 46 mL/min — ABNORMAL LOW (ref >90)
GFR calc non Af Amer: 40 mL/min — ABNORMAL LOW (ref >90)
Glucose, Bld: 106 mg/dL — ABNORMAL HIGH (ref 70–99)
Potassium: 4.1 mEq/L (ref 3.5–5.1)
Sodium: 140 mEq/L (ref 135–145)
Total Bilirubin: 0.3 mg/dL (ref 0.3–1.2)
Total Protein: 6.7 g/dL (ref 6.0–8.3)

## 2012-02-22 LAB — CBC
HCT: 37 % — ABNORMAL LOW (ref 39.0–52.0)
Hemoglobin: 12.4 g/dL — ABNORMAL LOW (ref 13.0–17.0)
RBC: 4.32 MIL/uL (ref 4.22–5.81)

## 2012-02-22 LAB — GLUCOSE, CAPILLARY
Glucose-Capillary: 110 mg/dL — ABNORMAL HIGH (ref 70–99)
Glucose-Capillary: 115 mg/dL — ABNORMAL HIGH (ref 70–99)

## 2012-02-22 LAB — PROTIME-INR: Prothrombin Time: 30.2 seconds — ABNORMAL HIGH (ref 11.6–15.2)

## 2012-02-22 MED ORDER — METRONIDAZOLE 500 MG PO TABS: 500.0000 mg | ORAL_TABLET | Freq: Three times a day (TID) | ORAL | Status: AC

## 2012-02-22 MED ORDER — WARFARIN SODIUM 4 MG PO TABS: 4.0000 mg | ORAL_TABLET | Freq: Every day | ORAL | Status: DC

## 2012-02-22 MED ORDER — WARFARIN SODIUM 4 MG PO TABS: 4.0000 mg | ORAL_TABLET | Freq: Every day | ORAL | Status: AC

## 2012-02-22 MED ORDER — CIPROFLOXACIN HCL 500 MG PO TABS: 500.0000 mg | ORAL_TABLET | Freq: Every day | ORAL | Status: AC

## 2012-02-22 NOTE — Discharge Summary (Signed)
Physician Discharge Summary  [name]:[id number] DOB: 10/09/1921 DOA: 02/16/2012  PCP: Pcp Not In System  Admit date: 02/16/2012 Discharge date: 02/22/2012  Time spent: > 35 minutes  Recommendations for Outpatient Follow-up:  1. Please check INR and adjust patient's coumadin 2. Also patient will need atleast 14 days of antibiotic therapy for bacteremia.  Please refer to details below.  Discharge Diagnoses:  Principal Problem:  *Bacteremia due to Gram-negative bacteria Active Problems:  Pacemaker  Senile dementia  Coronary artery disease  Hypertension  Abdominal pain  Chest pain  Renal insufficiency, mild  Chronic cerebral sinus venous thrombosis  Diverticulitis  Systolic CHF   Discharge Condition: stable  Diet recommendation: Diabetic/cardiac  Filed Weights   02/18/12 0500 02/19/12 0640 02/20/12 0700  Weight: 81.4 kg (179 lb 7.3 oz) 81.4 kg (179 lb 7.3 oz) 81.194 kg (179 lb)    History of present illness:  From original HPI: 78 yo male h/o dementia, tia, pacer, cad went to [facility] ED yesterday for cough, fever and was diagnosed with bronchitis, cxr was neg and was given augmentin. Pt went home and today his dtr was called back because his blood culture from yesterday was growing gram negative organism and cipro was called in for them and they were told to go back to ED, and they came to this ED. Pt has been complaining of pain all over. No n/v/d. He is demented and can answer yes/no questions but not reliable and dtr has left. His wbc yest was 6 and today is 12.    Hospital Course:  78 yo male h/o dementia, tia, pacer, cad went to [facility] ED for cough, fever and was diagnosed with bronchitis, cxr was neg and was given augmentin. Patient was called back because his blood culture grew GNR and was admitted for sepsis and abdominal pain. Source is likely diverticulitis and blood culture grew Raoultella planticola (previously Klebsiella sp.) sensitive to cipro.  Transitioned from zosyn to cipro/flagyl to complete a 14 day course. Patient was previously not tolerating much of a diet due to abdominal pain, but slowly improving.  Assessment/Plan:  Raoultella planticola (previously Klebsiella sp.) bacteremia: Most likely from diverticulitis. The patient has had severe episodes of bacteremia with various organisms, including pseudomonas about one year ago. According to daughter, the pseudomonas bacteremia was attributed to a PICC line and although there was discussion about removing the patient's pacemaker as a possible nidus of infection, the cardiologist felt that the patient would not survive the procedure. Raoultella is not a tenacious bacteria and is less likely to adhere to hardware. Per Dr. Orvan Falconer, treat for diverticulitis for 14 days, no need to address pacemaker at this time.  - Sensitivities placed on bedside chart  - Continue cipro/flagyl, day 7/14 of antibiotics   Abdominal pain, likely diverticulitis: TTP in the left lower and right upper quadrant. Reviewed CT scan from 12/21 with Dr. Chilton Si from radiology who sees some early stranding in the same area suggestive of diverticulitis. Mesenteric vessels appear patent.  - Cipro/flagyl day 7  - Advanced diet as tolerated to heart healthy diet/ diabetic diet.  Anemia, mild normocytic anemia, chronic and hgb at baseline of 11-12 for patient.   Thrombocytopenia, improving. Likely due to acute infection. DIC labs negative and no schistocytes on smear  Elevated D-dimer (part of DIC panel): Likely acute phase reactant in the setting of sepsis/diverticulitis. Patient is on therapeutic coumadin and therefore is lower risk for PE.  - Continue therapeutic coumadin  - INR to  be checked this upcomming Monday 12/30 by his PCP  Nongap metabolic acidosis. No diarrhea and kidney function at baseline. Concern for early gap metabolic acidosis, but lactate neg and patient is eating so less likely to be ketosis. Improving  with sodium bicarbonate fluids.  - IVF d/c'd  CKD, stage 3, creatinine at baseline.  - Avoid nephrotoxins. Creatinine continues to improve  - At risk for CIN after recent contrasted study but currently improving.   Wheezing: Resolved  *COPD: Avoid steroids if possible in setting of acute infection. Budesonide with duonebs prn  *Systolic CHF on ECHO today: Pro BNP elevated. Pt on BB, but not aspirin or plavix. Not on ACEI.  - Need previous cardiology records for comparison (old or new) and for explanation why he is not on traditional HF medications.  CAD with chest pain: troponins neg and ECG stable. Patient not on aspirin or plavix. + BB, no statin   HTN/HLD: BP stable. Cont. metoprolol   DM:  - Continue home regimen.  GERD: Stable.  Widespread chronic thrombosis of the superior sagittal sinus, straight sinus, transverse sinus and portion of the left sigmoid sinus 10/2010 which appeared stable on repeat CTa 04/2011:  - Continue coumadin. INR is supratherapeutic. Dosing per pharmacy while patient was in house.  Will recommend lower dose coumadin at this juncture and have patient f/u with PCP for further adjustments. - Outpatient follow up with Neurology   Hypothyroidism: Stable. TSH 0.5. Continue synthroid   Procedures:  Imaging studies as mentioned below  Consultations:  Case was discussed with Dr. Orvan Falconer  Discharge Exam: Filed Vitals:   02/21/12 1340 02/21/12 2000 02/21/12 2119 02/22/12 0443  BP: 132/80  133/76 142/88  Pulse: 85 84 84 84  Temp: 97 F (36.1 C)  97.8 F (36.6 C) 97.4 F (36.3 C)  TempSrc: Oral   Oral  Resp: 18 18 18 18   Height:      Weight:      SpO2: 97% 97% 95% 95%    General: Pt in NAD, Alert and Awake Cardiovascular: S1 and S2 WNL, no rubs Respiratory: CTA BL, no wheezes, breath sounds BL  Discharge Instructions  Discharge Orders    Future Orders Please Complete By Expires   Diet - low sodium heart healthy      Increase activity slowly        Discharge instructions      Comments:   Please be sure to follow up with your pcp 02/25/12 for further evaluation and recommendations.  You will need to have your INR rechecked at that time.   Call MD for:  temperature >100.4      Call MD for:  persistant nausea and vomiting      Call MD for:  persistant dizziness or light-headedness          Medication List     As of 02/22/2012  9:14 AM    STOP taking these medications         amoxicillin-clavulanate 875-125 MG per tablet   Commonly known as: AUGMENTIN      TAKE these medications         albuterol-ipratropium 18-103 MCG/ACT inhaler   Commonly known as: COMBIVENT   Inhale 2 puffs into the lungs every 4 (four) hours as needed. For wheezing.      ciprofloxacin 500 MG tablet   Commonly known as: CIPRO   Take 1 tablet (500 mg total) by mouth daily.      docusate sodium 100 MG capsule  Commonly known as: COLACE   Take 100 mg by mouth 2 (two) times daily as needed. For constipation      glipiZIDE 5 MG tablet   Commonly known as: GLUCOTROL   Take 5 mg by mouth 2 (two) times daily before a meal.      Hemocyte Plus 106-1 MG Caps   Take 1 tablet by mouth daily.      isosorbide mononitrate 30 MG 24 hr tablet   Commonly known as: IMDUR   Take 30 mg by mouth daily.      lactobacillus acidophilus Tabs   Take 2 tablets by mouth 3 (three) times daily.      levothyroxine 200 MCG tablet   Commonly known as: SYNTHROID, LEVOTHROID   Take 200 mcg by mouth daily.      lovastatin 10 MG tablet   Commonly known as: MEVACOR   Take 10 mg by mouth at bedtime.      metoprolol tartrate 25 MG tablet   Commonly known as: LOPRESSOR   Take 25 mg by mouth 2 (two) times daily.      metroNIDAZOLE 500 MG tablet   Commonly known as: FLAGYL   Take 1 tablet (500 mg total) by mouth 3 (three) times daily.      mupirocin cream 2 %   Commonly known as: BACTROBAN   Apply 1 application topically 3 (three) times daily.      nitroGLYCERIN 0.4  MG SL tablet   Commonly known as: NITROSTAT   Place 0.4 mg under the tongue every 5 (five) minutes as needed. For chest pain.      omeprazole 40 MG capsule   Commonly known as: PRILOSEC   Take 40 mg by mouth daily.      Tamsulosin HCl 0.4 MG Caps   Commonly known as: FLOMAX   Take 0.4 mg by mouth at bedtime.      warfarin 4 MG tablet   Commonly known as: COUMADIN   Take 1 tablet (4 mg total) by mouth daily. Take 4 mg one day, 6 mg one day, and alternate          The results of significant diagnostics from this hospitalization (including imaging, microbiology, ancillary and laboratory) are listed below for reference.    Significant Diagnostic Studies: Ct Abdomen Pelvis W Contrast  02/17/2012  **ADDENDUM** CREATED: 02/17/2012 14:43:21  Addendum by Dr. Christiana Pellant 02/17/2012 at 2:43 p.m.  I was asked to review this examination with additional clinical information of left lower quadrant abdominal pain.  There is minimal pericolonic stranding at the level of the mid descending colon without adjacent fluid collection, free air, or bowel wall thickening.  There is a diverticulum in this area and this could represent very early diverticulitis, although scarring could have a similar appearance. The remainder of the examination was not re- reviewed.  Findings discussed with Dr. Malachi Bonds by Dr. Chilton Si at the time of this addendum.  **END ADDENDUM** SIGNED BY: Harrel Lemon, M.D.   02/16/2012  *RADIOLOGY REPORT*  Clinical Data: Chest pain, kidney infection, diffuse abdominal pain, past history of COPD, coronary disease post MI, dementia, appendectomy, cholecystectomy, GERD  CT ABDOMEN AND PELVIS WITH CONTRAST  Technique:  Multidetector CT imaging of the abdomen and pelvis was performed following the standard protocol during bolus administration of intravenous contrast. Sagittal and coronal MPR images reconstructed from axial data set.  Contrast: 75mL OMNIPAQUE IOHEXOL 300 MG/ML  SOLN Dilute oral  contrast.  Comparison: None  Findings: Pacemaker lead  right ventricle. Minimal pericardial effusion. Tiny bibasilar pleural effusions with minimal adjacent lower lobe atelectasis. Prior cholecystectomy. Bilateral renal cortical atrophy with small cyst at inferior pole left kidney. No renal mass or hydronephrosis.  Liver, spleen, pancreas, and adrenal glands grossly normal appearance. Scattered respiratory motion artifacts. Duodenal diverticulum adjacent to second portion of duodenum. 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 Unremarkable bladder and ureters.  Extensive diverticulosis of the colon greatest at sigmoid colon without CT evidence of acute diverticulitis. Stomach and bowel loops otherwise normal appearance for degree of motion. No definite mass, adenopathy, free fluid, or inflammatory process. No hernia or free air. Bones demineralized.  IMPRESSION: Extensive colonic diverticulosis. Minimal pericardial and bilateral pleural effusion. Bilateral renal cortical atrophy. 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 No definite acute intra abdominal or pelvic abnormalities.   Original Report Authenticated By: Ulyses Southward, M.D.    Dg Chest Port 1 View  02/16/2012  *RADIOLOGY REPORT*  Clinical Data: Chest pain, cough, congestion, hypertension, diabetes, COPD, coronary disease, former smoker  PORTABLE CHEST - 1 VIEW  Comparison: Portable exam 1649 hours compared to 07/12/2011  Findings: Patient's chin obscures the medial lung apices. Right subclavian sequential transveous pacemaker leads project at right atrium and right ventricle. Minimal enlargement of cardiac silhouette. Tortuous aorta. Pulmonary vascularity normal for technique. Calcified granulomata at left apex. Bronchitic changes with question infiltrate versus atelectasis at right lung base. No pleural effusion or  pneumothorax. Bones demineralized.  IMPRESSION: Question mild atelectasis versus infiltrate at right base. Bronchitic and old granulomas disease changes.   Original Report Authenticated By: Ulyses Southward, M.D.     Microbiology: Recent Results (from the past 240 hour(s))  CULTURE, BLOOD (ROUTINE X 2)     Status: Normal (Preliminary result)   Collection Time   02/16/12  8:00 PM      Component Value Range Status Comment   Specimen Description BLOOD LEFT HAND   Final    Special Requests BOTTLES DRAWN AEROBIC ONLY 1.5ML   Final    Culture  Setup Time 02/17/2012 02:29   Final    Culture     Final    Value:        BLOOD CULTURE RECEIVED NO GROWTH TO DATE CULTURE WILL BE HELD FOR 5 DAYS BEFORE ISSUING A FINAL NEGATIVE REPORT   Report Status PENDING   Incomplete   CULTURE, BLOOD (ROUTINE X 2)     Status: Normal (Preliminary result)   Collection Time   02/16/12  8:15 PM      Component Value Range Status Comment   Specimen Description BLOOD LEFT HAND   Final    Special Requests BOTTLES DRAWN AEROBIC ONLY   Final    Culture  Setup Time 02/17/2012 02:29   Final    Culture     Final    Value:        BLOOD CULTURE RECEIVED NO GROWTH TO DATE CULTURE WILL BE HELD FOR 5 DAYS BEFORE ISSUING A FINAL NEGATIVE REPORT   Report Status PENDING   Incomplete   MRSA PCR SCREENING     Status: Normal   Collection Time   02/16/12 11:30 PM      Component Value Range Status Comment   MRSA by PCR NEGATIVE  NEGATIVE Final      Labs: Basic Metabolic Panel:  Lab 02/21/12 1610 02/20/12 0447 02/19/12 0515 02/18/12 0530 02/17/12 1558  NA 143 140 136 137 137  K 3.6 3.4* 3.5 4.0 4.1  CL 110 107 104 108 108  CO2 24 22 21  18* 19  GLUCOSE 99 88 130* 104* 79  BUN 18 17 22  28* 29*  CREATININE 1.48* 1.52* 1.55* 1.57* 1.51*  CALCIUM 8.5 8.5 8.5 8.6 8.5  MG -- -- -- -- --  PHOS -- -- -- -- --   Liver Function Tests:  Lab 02/21/12 0540 02/20/12 0447 02/19/12 0515 02/18/12 0530 02/16/12 1648  AST 14 14 16 20 30   ALT  9 10 12 12 17   ALKPHOS 78 79 86 90 111  BILITOT 0.2* 0.2* 0.2* 0.3 0.5  PROT 6.0 6.0 6.2 6.2 7.0  ALBUMIN 2.4* 2.4* 2.5* 2.5* 3.2*    Lab 02/16/12 1648  LIPASE 9*  AMYLASE --   No results found for this basename: AMMONIA:5 in the last 168 hours CBC:  Lab 02/22/12 0705 02/21/12 0540 02/20/12 0447 02/19/12 0515 02/18/12 0530 02/17/12 1558 02/16/12 1648  WBC 8.3 6.4 6.4 8.2 8.4 -- --  NEUTROABS -- -- -- -- -- 5.9 9.7*  HGB 12.4* 11.0* 11.2* 11.5* 12.5* -- --  HCT 37.0* 32.8* 33.5* 34.5* 36.6* -- --  MCV 85.6 84.3 85.2 86.0 85.3 -- --  PLT 168 130* 124* 118* 88* -- --   Cardiac Enzymes:  Lab 02/17/12 1004 02/17/12 0514 02/16/12 2232 02/16/12 1651  CKTOTAL -- -- -- --  CKMB -- -- -- --  CKMBINDEX -- -- -- --  TROPONINI <0.30 <0.30 <0.30 <0.30   BNP: BNP (last 3 results)  Basename 02/18/12 1618  PROBNP 7486.0*   CBG:  Lab 02/21/12 2117 02/21/12 1618 02/21/12 1149 02/21/12 0752 02/20/12 2013  GLUCAP 112* 140* 128* 111* 127*       Signed:  Penny Pia  [facility] 02/22/2012, 9:14 AM

## 2012-02-22 NOTE — Progress Notes (Signed)
Physical Therapy Treatment Patient Details Name: [name] MRN: [id number] DOB: September 05, 1921 Today's Date: 02/22/2012 Time: 2130-8657 PT Time Calculation (min): 30 min  PT Assessment / Plan / Recommendation Comments on Treatment Session  pt presents with Bacteremia and generally weak.  pt  and daughter anxious to D/C to home today.      Follow Up Recommendations  No PT follow up;Supervision/Assistance - 24 hour     Does the patient have the potential to tolerate intense rehabilitation     Barriers to Discharge        Equipment Recommendations  None recommended by PT    Recommendations for Other Services    Frequency Min 3X/week   Plan Discharge plan remains appropriate;Frequency remains appropriate    Precautions / Restrictions Precautions Precautions: Fall Restrictions Weight Bearing Restrictions: No   Pertinent Vitals/Pain Denies pain.      Mobility  Bed Mobility Bed Mobility: Supine to Sit;Sitting - Scoot to Edge of Bed Supine to Sit: 3: Mod assist Sitting - Scoot to Edge of Bed: 5: Supervision Details for Bed Mobility Assistance: Cues for sequencing, A with trunk Transfers Transfers: Sit to Stand;Stand to Sit;Stand Pivot Transfers Sit to Stand: 1: +2 Total assist;With upper extremity assist;From bed;From chair/3-in-1;With armrests Sit to Stand: Patient Percentage: 50% Stand to Sit: 1: +2 Total assist;With upper extremity assist;To chair/3-in-1;With armrests Stand to Sit: Patient Percentage: 50% Stand Pivot Transfers: 1: +2 Total assist;With armrests Stand Pivot Transfers: Patient Percentage: 50% Details for Transfer Assistance: 2nd person used for safety.  pt needs at times hand over hand cueing for safe technique.   Ambulation/Gait Ambulation/Gait Assistance: Not tested (comment) Stairs: No Wheelchair Mobility Wheelchair Mobility: No    Exercises     PT Diagnosis:    PT Problem List:   PT Treatment Interventions:     PT Goals Acute Rehab PT  Goals Time For Goal Achievement: 03/06/12 Potential to Achieve Goals: Good PT Goal: Sit to Stand - Progress: Progressing toward goal PT Goal: Stand to Sit - Progress: Progressing toward goal PT Transfer Goal: Bed to Chair/Chair to Bed - Progress: Progressing toward goal  Visit Information  Last PT Received On: 02/22/12 Assistance Needed: +2 (used for safety.  )    Subjective Data  Subjective: I'm just wore out.     Cognition  Overall Cognitive Status: History of cognitive impairments - at baseline Arousal/Alertness: Lethargic Orientation Level: Disoriented to;Place;Time;Situation Behavior During Session: Lethargic    Balance  Balance Balance Assessed: No  End of Session PT - End of Session Equipment Utilized During Treatment: Gait belt Activity Tolerance: Patient limited by fatigue Patient left: in chair;with call bell/phone within reach;with family/visitor present (pt left sitting in W/C) Nurse Communication: Mobility status   GP     Sunny Schlein, [place] [phone] 02/22/2012, 10:43 AM

## 2012-02-23 LAB — CULTURE, BLOOD (ROUTINE X 2): Culture: NO GROWTH

## 2013-07-30 IMAGING — CT CT ABD-PELV W/ CM
2 of 5 series · 13 of 32 positions shown, 18 images · IV contrast (omnipaque)
Comparison: None

***ADDENDUM*** CREATED: 02/17/2012 [DATE]

asked to review this examination with additional clinical
information of left lower quadrant abdominal pain.  There is
minimal pericolonic stranding at the level of the mid descending
colon without adjacent fluid collection, free air, or bowel wall
thickening.  There is a diverticulum in this area and this could
represent very early diverticulitis, although scarring could have a
similar appearance. The remainder of the examination was not re-
reviewed.  Findings discussed with Dr. Rashmi by Dr. Manny at the
time of this addendum.
CLINICAL DATA: Chest pain, kidney infection, diffuse abdominal
pain, past history of COPD, coronary disease post MI, dementia,
appendectomy, cholecystectomy, GERD
CT ABDOMEN AND PELVIS WITH CONTRAST
TECHNIQUE: Multidetector CT imaging of the abdomen and pelvis was
performed following the standard protocol during bolus
administration of intravenous contrast. Sagittal and coronal MPR
images reconstructed from axial data set.
Contrast: 75mL OMNIPAQUE IOHEXOL 300 MG/ML  SOLN Dilute oral
contrast.

[Series 2: routine abdomen · axial · 0.86mm/px · z∈[-419,-89]mm · 7 of 90 slices shown, 12 images]
[im 12/90  soft-tissue]
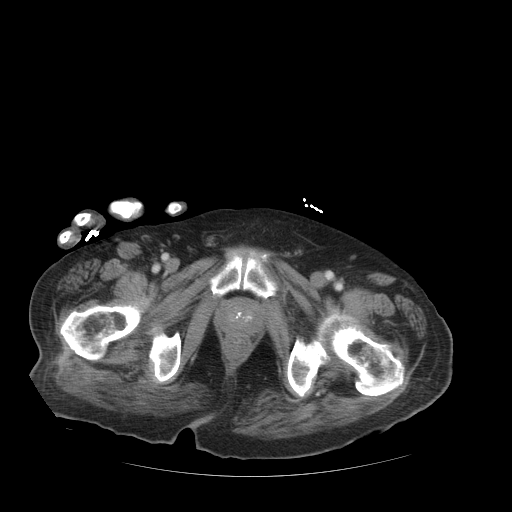
[im 12/90  bone]
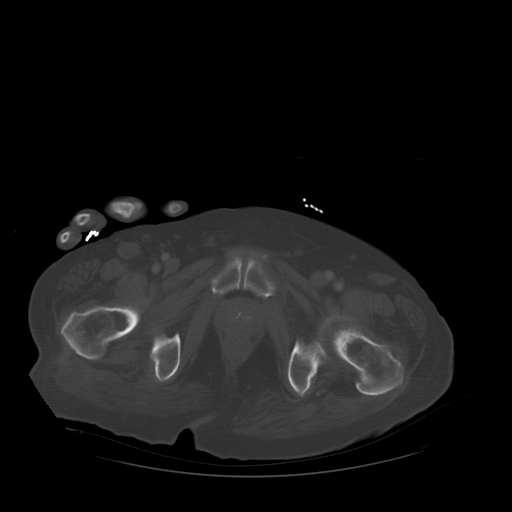
[im 23/90  soft-tissue]
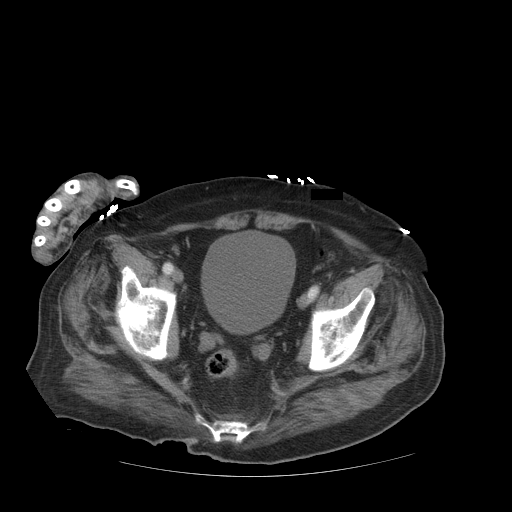
[im 34/90  soft-tissue]
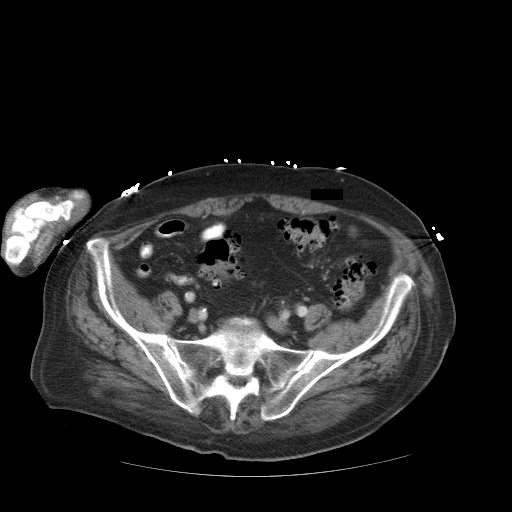
[im 45/90  soft-tissue]
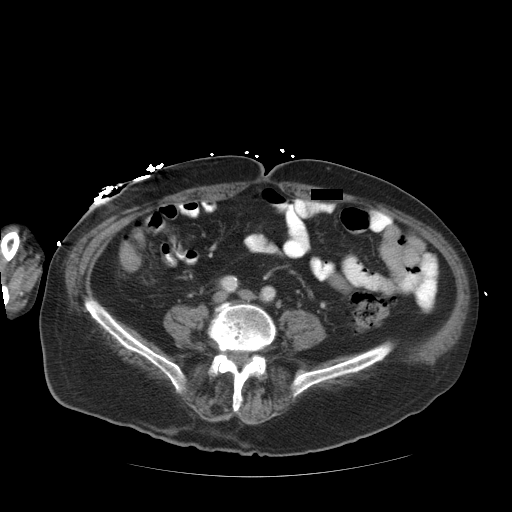
[im 45/90  lung]
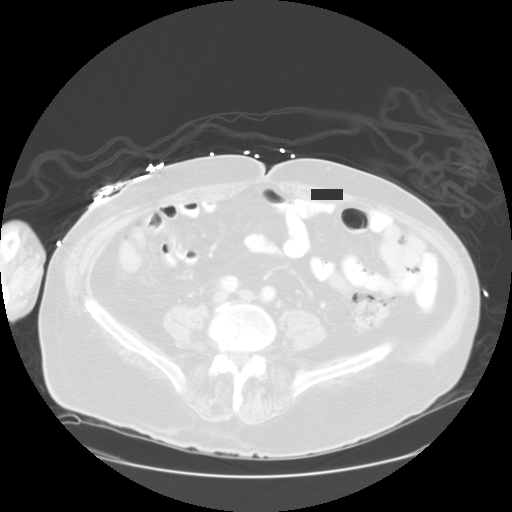
[im 56/90  soft-tissue]
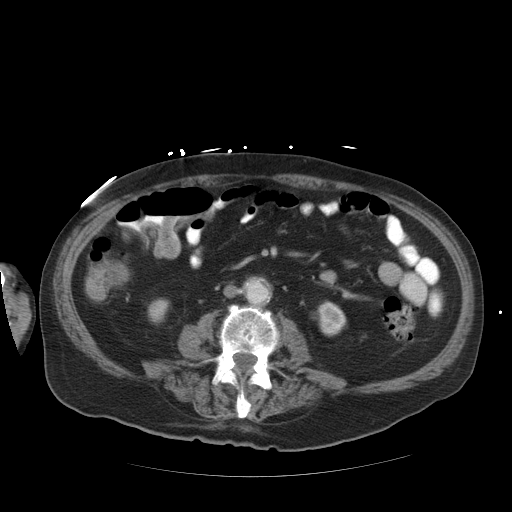
[im 56/90  lung]
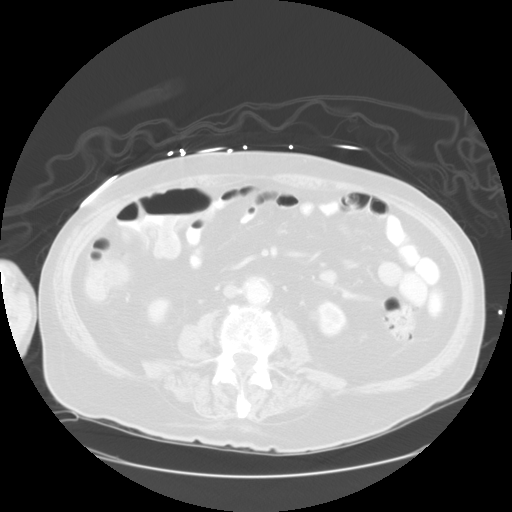
[im 67/90  soft-tissue]
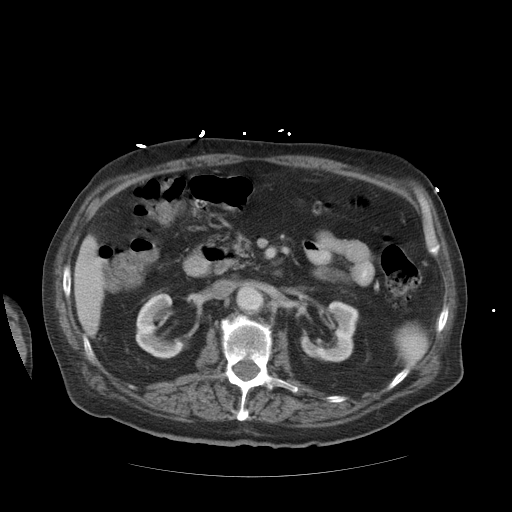
[im 67/90  lung]
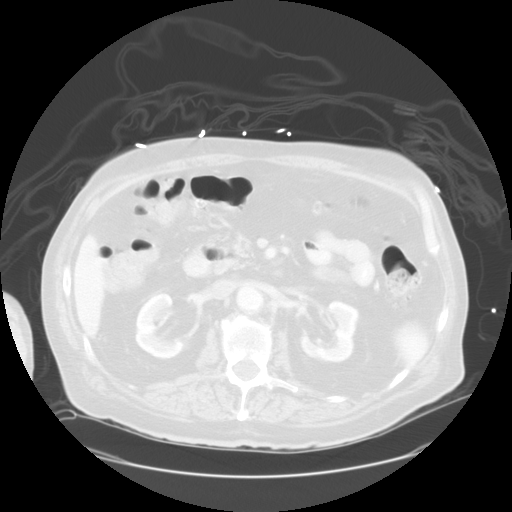
[im 78/90  soft-tissue]
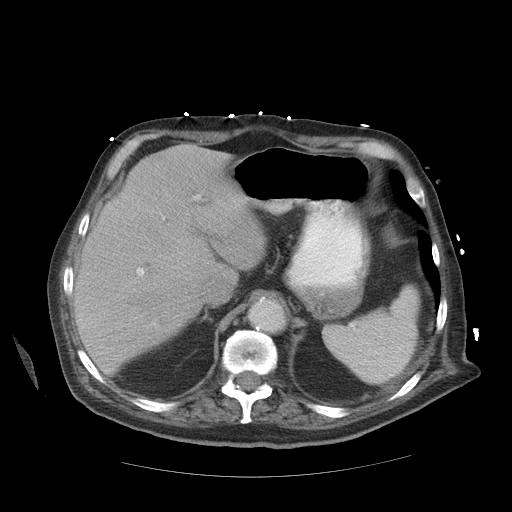
[im 78/90  lung]
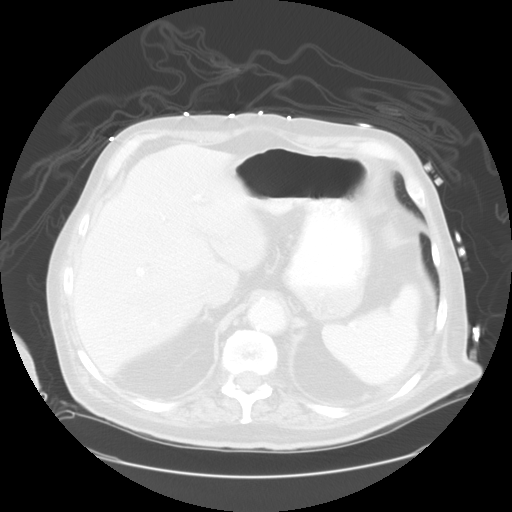

[Series 401: sag · sagittal · 0.89mm/px · 6 of 118 slices shown]
[im 12/118  soft-tissue]
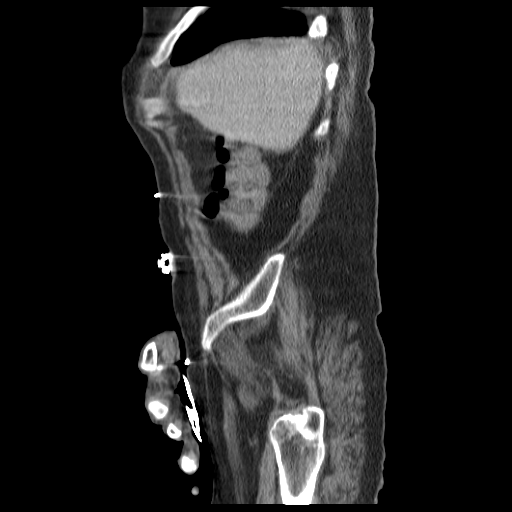
[im 24/118  soft-tissue]
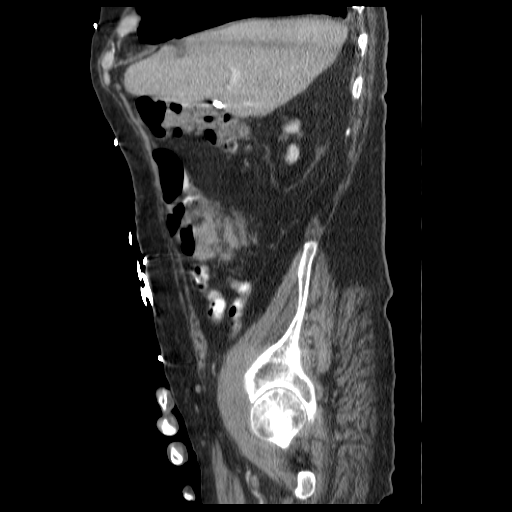
[im 36/118  soft-tissue]
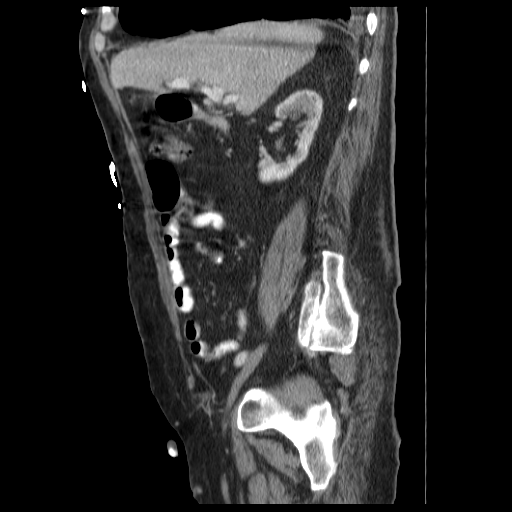
[im 47/118  soft-tissue]
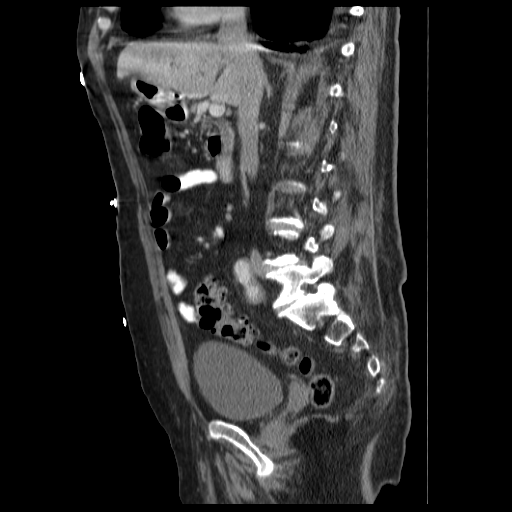
[im 71/118  soft-tissue]
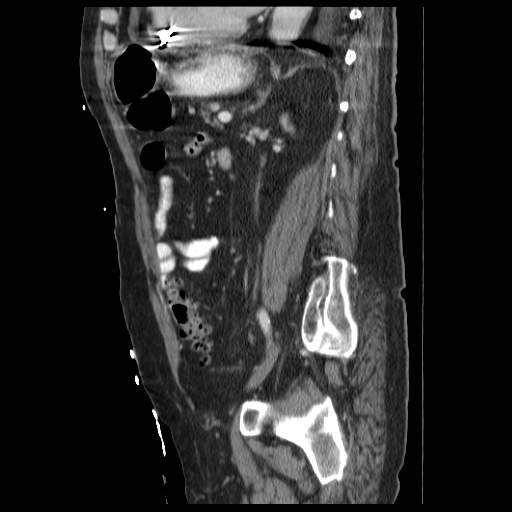
[im 82/118  soft-tissue]
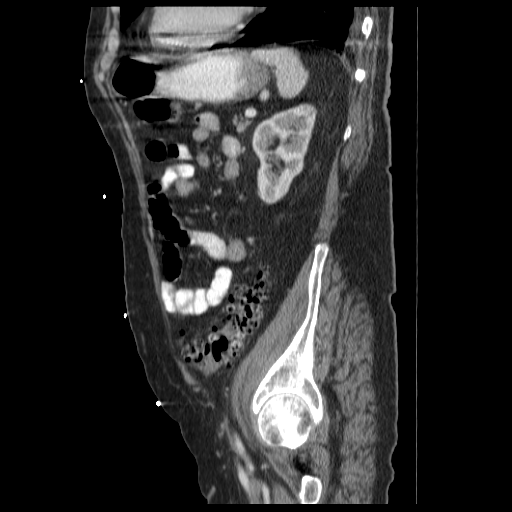

[13 of 32 positions shown; findings below may reference images not displayed]

FINDINGS: Pacemaker lead right ventricle.
Minimal pericardial effusion.
Tiny bibasilar pleural effusions with minimal adjacent lower lobe
atelectasis.
Prior cholecystectomy.
Bilateral renal cortical atrophy with small cyst at inferior pole
left kidney.
No renal mass or hydronephrosis.

Liver, spleen, pancreas, and adrenal glands grossly normal
appearance.
Scattered respiratory motion artifacts.
Duodenal diverticulum adjacent to second portion of duodenum.
Atherosclerotic changes of the aorta and iliac arteries with
aneurysmal dilatation of left common iliac artery 2.6 cm diameter
and right common iliac artery 2.0 cm.
Unremarkable bladder and ureters.

Extensive diverticulosis of the colon greatest at sigmoid colon
without CT evidence of acute diverticulitis.
Stomach and bowel loops otherwise normal appearance for degree of
motion.
No definite mass, adenopathy, free fluid, or inflammatory process.
No hernia or free air.
Bones demineralized.
IMPRESSION: Extensive colonic diverticulosis.
Minimal pericardial and bilateral pleural effusion.
Bilateral renal cortical atrophy.
Mild bilateral common iliac artery aneurysm formation.
No definite acute intra abdominal or pelvic abnormalities.

## 2014-04-26 DIAGNOSIS — R791 Abnormal coagulation profile: Secondary | ICD-10-CM | POA: Diagnosis not present

## 2014-05-12 DIAGNOSIS — R791 Abnormal coagulation profile: Secondary | ICD-10-CM | POA: Diagnosis not present

## 2014-05-27 DIAGNOSIS — E119 Type 2 diabetes mellitus without complications: Secondary | ICD-10-CM | POA: Diagnosis not present

## 2014-06-02 DIAGNOSIS — Z7901 Long term (current) use of anticoagulants: Secondary | ICD-10-CM | POA: Diagnosis not present

## 2014-07-20 DIAGNOSIS — Z45010 Encounter for checking and testing of cardiac pacemaker pulse generator [battery]: Secondary | ICD-10-CM | POA: Diagnosis not present

## 2014-07-20 DIAGNOSIS — I498 Other specified cardiac arrhythmias: Secondary | ICD-10-CM | POA: Diagnosis not present

## 2014-09-02 DIAGNOSIS — E119 Type 2 diabetes mellitus without complications: Secondary | ICD-10-CM | POA: Diagnosis not present

## 2014-09-21 DIAGNOSIS — E119 Type 2 diabetes mellitus without complications: Secondary | ICD-10-CM | POA: Diagnosis not present

## 2014-09-21 DIAGNOSIS — I2510 Atherosclerotic heart disease of native coronary artery without angina pectoris: Secondary | ICD-10-CM | POA: Diagnosis not present

## 2014-09-21 DIAGNOSIS — E038 Other specified hypothyroidism: Secondary | ICD-10-CM | POA: Diagnosis not present

## 2014-09-21 DIAGNOSIS — R791 Abnormal coagulation profile: Secondary | ICD-10-CM | POA: Diagnosis not present

## 2014-09-21 DIAGNOSIS — I10 Essential (primary) hypertension: Secondary | ICD-10-CM | POA: Diagnosis not present

## 2014-10-13 DIAGNOSIS — R791 Abnormal coagulation profile: Secondary | ICD-10-CM | POA: Diagnosis not present

## 2014-10-13 DIAGNOSIS — I10 Essential (primary) hypertension: Secondary | ICD-10-CM | POA: Diagnosis not present

## 2014-10-13 DIAGNOSIS — I82409 Acute embolism and thrombosis of unspecified deep veins of unspecified lower extremity: Secondary | ICD-10-CM | POA: Diagnosis not present

## 2014-10-15 DIAGNOSIS — I498 Other specified cardiac arrhythmias: Secondary | ICD-10-CM | POA: Diagnosis not present

## 2014-10-15 DIAGNOSIS — Z45018 Encounter for adjustment and management of other part of cardiac pacemaker: Secondary | ICD-10-CM | POA: Diagnosis not present

## 2014-11-05 DIAGNOSIS — R791 Abnormal coagulation profile: Secondary | ICD-10-CM | POA: Diagnosis not present

## 2014-11-23 DIAGNOSIS — R791 Abnormal coagulation profile: Secondary | ICD-10-CM | POA: Diagnosis not present

## 2014-11-23 DIAGNOSIS — I82409 Acute embolism and thrombosis of unspecified deep veins of unspecified lower extremity: Secondary | ICD-10-CM | POA: Diagnosis not present

## 2014-12-16 DIAGNOSIS — E119 Type 2 diabetes mellitus without complications: Secondary | ICD-10-CM | POA: Diagnosis not present

## 2015-01-03 DIAGNOSIS — E785 Hyperlipidemia, unspecified: Secondary | ICD-10-CM | POA: Diagnosis not present

## 2015-01-03 DIAGNOSIS — R791 Abnormal coagulation profile: Secondary | ICD-10-CM | POA: Diagnosis not present

## 2015-01-03 DIAGNOSIS — E559 Vitamin D deficiency, unspecified: Secondary | ICD-10-CM | POA: Diagnosis not present

## 2015-01-03 DIAGNOSIS — D500 Iron deficiency anemia secondary to blood loss (chronic): Secondary | ICD-10-CM | POA: Diagnosis not present

## 2015-01-03 DIAGNOSIS — Z23 Encounter for immunization: Secondary | ICD-10-CM | POA: Diagnosis not present

## 2015-01-03 DIAGNOSIS — E119 Type 2 diabetes mellitus without complications: Secondary | ICD-10-CM | POA: Diagnosis not present

## 2015-01-18 DIAGNOSIS — Z45018 Encounter for adjustment and management of other part of cardiac pacemaker: Secondary | ICD-10-CM | POA: Diagnosis not present

## 2015-01-18 DIAGNOSIS — I498 Other specified cardiac arrhythmias: Secondary | ICD-10-CM | POA: Diagnosis not present

## 2015-01-24 DIAGNOSIS — R791 Abnormal coagulation profile: Secondary | ICD-10-CM | POA: Diagnosis not present

## 2015-01-24 DIAGNOSIS — I82409 Acute embolism and thrombosis of unspecified deep veins of unspecified lower extremity: Secondary | ICD-10-CM | POA: Diagnosis not present

## 2015-03-15 DIAGNOSIS — R791 Abnormal coagulation profile: Secondary | ICD-10-CM | POA: Diagnosis not present

## 2015-03-31 DIAGNOSIS — R791 Abnormal coagulation profile: Secondary | ICD-10-CM | POA: Diagnosis not present

## 2015-04-20 DIAGNOSIS — I498 Other specified cardiac arrhythmias: Secondary | ICD-10-CM | POA: Diagnosis not present

## 2015-04-20 DIAGNOSIS — Z45018 Encounter for adjustment and management of other part of cardiac pacemaker: Secondary | ICD-10-CM | POA: Diagnosis not present

## 2015-05-31 DIAGNOSIS — E119 Type 2 diabetes mellitus without complications: Secondary | ICD-10-CM | POA: Diagnosis not present

## 2015-06-24 DIAGNOSIS — G08 Intracranial and intraspinal phlebitis and thrombophlebitis: Secondary | ICD-10-CM | POA: Diagnosis not present

## 2015-06-24 DIAGNOSIS — I82409 Acute embolism and thrombosis of unspecified deep veins of unspecified lower extremity: Secondary | ICD-10-CM | POA: Diagnosis not present

## 2015-06-24 DIAGNOSIS — R791 Abnormal coagulation profile: Secondary | ICD-10-CM | POA: Diagnosis not present

## 2015-06-24 DIAGNOSIS — E038 Other specified hypothyroidism: Secondary | ICD-10-CM | POA: Diagnosis not present

## 2015-06-24 DIAGNOSIS — E119 Type 2 diabetes mellitus without complications: Secondary | ICD-10-CM | POA: Diagnosis not present

## 2015-06-24 DIAGNOSIS — Z9181 History of falling: Secondary | ICD-10-CM | POA: Diagnosis not present

## 2015-07-23 DIAGNOSIS — Z950 Presence of cardiac pacemaker: Secondary | ICD-10-CM | POA: Diagnosis not present

## 2015-09-22 DIAGNOSIS — E119 Type 2 diabetes mellitus without complications: Secondary | ICD-10-CM | POA: Diagnosis not present

## 2015-11-03 DIAGNOSIS — Z950 Presence of cardiac pacemaker: Secondary | ICD-10-CM | POA: Diagnosis not present

## 2016-01-06 DIAGNOSIS — E119 Type 2 diabetes mellitus without complications: Secondary | ICD-10-CM | POA: Diagnosis not present

## 2016-01-12 DIAGNOSIS — E785 Hyperlipidemia, unspecified: Secondary | ICD-10-CM | POA: Diagnosis not present

## 2016-01-12 DIAGNOSIS — R791 Abnormal coagulation profile: Secondary | ICD-10-CM | POA: Diagnosis not present

## 2016-01-12 DIAGNOSIS — G08 Intracranial and intraspinal phlebitis and thrombophlebitis: Secondary | ICD-10-CM | POA: Diagnosis not present

## 2016-01-12 DIAGNOSIS — I82409 Acute embolism and thrombosis of unspecified deep veins of unspecified lower extremity: Secondary | ICD-10-CM | POA: Diagnosis not present

## 2016-01-12 DIAGNOSIS — Z23 Encounter for immunization: Secondary | ICD-10-CM | POA: Diagnosis not present

## 2016-01-12 DIAGNOSIS — E119 Type 2 diabetes mellitus without complications: Secondary | ICD-10-CM | POA: Diagnosis not present

## 2016-02-02 DIAGNOSIS — Z950 Presence of cardiac pacemaker: Secondary | ICD-10-CM | POA: Diagnosis not present

## 2016-05-03 DIAGNOSIS — Z950 Presence of cardiac pacemaker: Secondary | ICD-10-CM | POA: Diagnosis not present

## 2016-05-20 DIAGNOSIS — Z950 Presence of cardiac pacemaker: Secondary | ICD-10-CM | POA: Diagnosis not present

## 2016-05-20 DIAGNOSIS — N3000 Acute cystitis without hematuria: Secondary | ICD-10-CM | POA: Diagnosis not present

## 2016-05-20 DIAGNOSIS — Z66 Do not resuscitate: Secondary | ICD-10-CM | POA: Diagnosis not present

## 2016-05-20 DIAGNOSIS — F0390 Unspecified dementia without behavioral disturbance: Secondary | ICD-10-CM | POA: Diagnosis not present

## 2016-05-20 DIAGNOSIS — Z8673 Personal history of transient ischemic attack (TIA), and cerebral infarction without residual deficits: Secondary | ICD-10-CM | POA: Diagnosis not present

## 2016-05-20 DIAGNOSIS — L89899 Pressure ulcer of other site, unspecified stage: Secondary | ICD-10-CM | POA: Diagnosis not present

## 2016-05-20 DIAGNOSIS — I2510 Atherosclerotic heart disease of native coronary artery without angina pectoris: Secondary | ICD-10-CM | POA: Diagnosis not present

## 2016-05-20 DIAGNOSIS — H548 Legal blindness, as defined in USA: Secondary | ICD-10-CM | POA: Diagnosis not present

## 2016-05-20 DIAGNOSIS — R05 Cough: Secondary | ICD-10-CM | POA: Diagnosis not present

## 2016-05-20 DIAGNOSIS — I482 Chronic atrial fibrillation: Secondary | ICD-10-CM | POA: Diagnosis not present

## 2016-05-20 DIAGNOSIS — Z7902 Long term (current) use of antithrombotics/antiplatelets: Secondary | ICD-10-CM | POA: Diagnosis not present

## 2016-05-20 DIAGNOSIS — L8990 Pressure ulcer of unspecified site, unspecified stage: Secondary | ICD-10-CM | POA: Diagnosis not present

## 2016-05-20 DIAGNOSIS — R279 Unspecified lack of coordination: Secondary | ICD-10-CM | POA: Diagnosis not present

## 2016-05-20 DIAGNOSIS — Z79899 Other long term (current) drug therapy: Secondary | ICD-10-CM | POA: Diagnosis not present

## 2016-05-20 DIAGNOSIS — I959 Hypotension, unspecified: Secondary | ICD-10-CM | POA: Diagnosis not present

## 2016-05-20 DIAGNOSIS — L89109 Pressure ulcer of unspecified part of back, unspecified stage: Secondary | ICD-10-CM | POA: Diagnosis not present

## 2016-05-20 DIAGNOSIS — J440 Chronic obstructive pulmonary disease with acute lower respiratory infection: Secondary | ICD-10-CM | POA: Diagnosis not present

## 2016-05-20 DIAGNOSIS — Z87891 Personal history of nicotine dependence: Secondary | ICD-10-CM | POA: Diagnosis not present

## 2016-05-20 DIAGNOSIS — N289 Disorder of kidney and ureter, unspecified: Secondary | ICD-10-CM | POA: Diagnosis not present

## 2016-05-20 DIAGNOSIS — B964 Proteus (mirabilis) (morganii) as the cause of diseases classified elsewhere: Secondary | ICD-10-CM | POA: Diagnosis not present

## 2016-05-20 DIAGNOSIS — Z7401 Bed confinement status: Secondary | ICD-10-CM | POA: Diagnosis not present

## 2016-05-20 DIAGNOSIS — N390 Urinary tract infection, site not specified: Secondary | ICD-10-CM | POA: Diagnosis not present

## 2016-05-20 DIAGNOSIS — R338 Other retention of urine: Secondary | ICD-10-CM | POA: Diagnosis not present

## 2016-05-20 DIAGNOSIS — E039 Hypothyroidism, unspecified: Secondary | ICD-10-CM | POA: Diagnosis not present

## 2016-05-20 DIAGNOSIS — Z885 Allergy status to narcotic agent status: Secondary | ICD-10-CM | POA: Diagnosis not present

## 2016-05-20 DIAGNOSIS — E43 Unspecified severe protein-calorie malnutrition: Secondary | ICD-10-CM | POA: Diagnosis not present

## 2016-05-20 DIAGNOSIS — E46 Unspecified protein-calorie malnutrition: Secondary | ICD-10-CM | POA: Diagnosis not present

## 2016-05-20 DIAGNOSIS — I509 Heart failure, unspecified: Secondary | ICD-10-CM | POA: Diagnosis not present

## 2016-05-20 DIAGNOSIS — R918 Other nonspecific abnormal finding of lung field: Secondary | ICD-10-CM | POA: Diagnosis not present

## 2016-05-20 DIAGNOSIS — J189 Pneumonia, unspecified organism: Secondary | ICD-10-CM | POA: Diagnosis not present

## 2016-05-23 DIAGNOSIS — J189 Pneumonia, unspecified organism: Secondary | ICD-10-CM | POA: Diagnosis not present

## 2016-05-28 DIAGNOSIS — E119 Type 2 diabetes mellitus without complications: Secondary | ICD-10-CM | POA: Diagnosis not present

## 2016-08-26 DEATH — deceased
